# Patient Record
Sex: Male | Born: 1940 | Race: White | Hispanic: No
Health system: Midwestern US, Academic
[De-identification: ages and names within clinical notes are randomized; demographics above are authoritative.]

---

## 2017-03-07 LAB — COMPREHENSIVE METABOLIC PANEL
Lab: 139 — ABNORMAL HIGH (ref 4.1–5.7)
Lab: 30
Lab: 4.5
Lab: 49 — ABNORMAL HIGH (ref 8–40)
Lab: 7.4
Lab: 75

## 2017-03-07 LAB — LIPID PROFILE: Lab: 105 — ABNORMAL HIGH (ref 13.1–16.8)

## 2017-03-07 LAB — THYROID STIMULATING HORMONE-TSH: Lab: 4.7

## 2017-03-07 LAB — PROSTATIC SPECIFIC ANTIGEN-PSA: Lab: 1.3 — ABNORMAL LOW (ref >40)

## 2017-03-07 LAB — CBC: Lab: 7.1

## 2017-03-07 LAB — FREE T4 (FREE THYROXINE) ONLY: Lab: 1.3 — ABNORMAL HIGH (ref 38.2–48.4)

## 2017-03-07 LAB — TOTAL THYROXINE T4: Lab: 9.9 — ABNORMAL HIGH (ref 9–25)

## 2018-01-12 ENCOUNTER — Encounter: Admit: 2018-01-12 | Discharge: 2018-01-12 | Payer: MEDICARE

## 2018-02-03 ENCOUNTER — Encounter: Admit: 2018-02-03 | Discharge: 2018-02-03 | Payer: MEDICARE

## 2018-02-06 ENCOUNTER — Encounter: Admit: 2018-02-06 | Discharge: 2018-02-06 | Payer: MEDICARE

## 2018-02-06 DIAGNOSIS — R0600 Dyspnea, unspecified: Secondary | ICD-10-CM

## 2018-02-06 DIAGNOSIS — E785 Hyperlipidemia, unspecified: Secondary | ICD-10-CM

## 2018-02-06 DIAGNOSIS — I10 Essential (primary) hypertension: Secondary | ICD-10-CM

## 2018-02-06 DIAGNOSIS — E119 Type 2 diabetes mellitus without complications: Secondary | ICD-10-CM

## 2018-02-06 DIAGNOSIS — K219 Gastro-esophageal reflux disease without esophagitis: Secondary | ICD-10-CM

## 2018-02-09 ENCOUNTER — Encounter: Admit: 2018-02-09 | Discharge: 2018-02-09 | Payer: MEDICARE

## 2018-02-09 ENCOUNTER — Ambulatory Visit: Admit: 2018-02-09 | Discharge: 2018-02-09 | Payer: MEDICARE

## 2018-02-09 DIAGNOSIS — I10 Essential (primary) hypertension: Secondary | ICD-10-CM

## 2018-02-09 DIAGNOSIS — R200 Anesthesia of skin: Secondary | ICD-10-CM

## 2018-02-09 DIAGNOSIS — R0600 Dyspnea, unspecified: Secondary | ICD-10-CM

## 2018-02-09 DIAGNOSIS — E119 Type 2 diabetes mellitus without complications: Secondary | ICD-10-CM

## 2018-02-09 DIAGNOSIS — Z951 Presence of aortocoronary bypass graft: Secondary | ICD-10-CM

## 2018-02-09 DIAGNOSIS — E7800 Pure hypercholesterolemia, unspecified: Secondary | ICD-10-CM

## 2018-02-09 DIAGNOSIS — K219 Gastro-esophageal reflux disease without esophagitis: Secondary | ICD-10-CM

## 2018-02-09 DIAGNOSIS — I2510 Atherosclerotic heart disease of native coronary artery without angina pectoris: Secondary | ICD-10-CM

## 2018-02-09 DIAGNOSIS — E785 Hyperlipidemia, unspecified: Secondary | ICD-10-CM

## 2018-02-09 DIAGNOSIS — I4891 Unspecified atrial fibrillation: Secondary | ICD-10-CM

## 2018-02-09 MED ORDER — APIXABAN 5 MG PO TAB
5 mg | ORAL_TABLET | Freq: Two times a day (BID) | ORAL | 11 refills | Status: AC
Start: 2018-02-09 — End: 2018-02-09

## 2018-02-09 MED ORDER — APIXABAN 5 MG PO TAB
5 mg | ORAL_TABLET | Freq: Two times a day (BID) | ORAL | 1 refills | Status: AC
Start: 2018-02-09 — End: 2018-02-09

## 2018-02-09 MED ORDER — METOPROLOL TARTRATE 25 MG PO TAB
25 mg | ORAL_TABLET | Freq: Two times a day (BID) | ORAL | 11 refills | 90.00000 days | Status: AC
Start: 2018-02-09 — End: ?

## 2018-02-09 MED ORDER — APIXABAN 5 MG PO TAB
5 mg | ORAL_TABLET | Freq: Two times a day (BID) | ORAL | 11 refills | Status: AC
Start: 2018-02-09 — End: ?

## 2018-02-09 MED ORDER — APIXABAN 5 MG PO TAB
5 mg | ORAL_TABLET | Freq: Two times a day (BID) | ORAL | 11 refills | Status: CN
Start: 2018-02-09 — End: ?

## 2018-02-10 ENCOUNTER — Encounter: Admit: 2018-02-10 | Discharge: 2018-02-10 | Payer: MEDICARE

## 2018-02-10 DIAGNOSIS — I2510 Atherosclerotic heart disease of native coronary artery without angina pectoris: Secondary | ICD-10-CM

## 2018-02-10 DIAGNOSIS — I4891 Unspecified atrial fibrillation: Secondary | ICD-10-CM

## 2018-02-10 DIAGNOSIS — Z951 Presence of aortocoronary bypass graft: Secondary | ICD-10-CM

## 2018-02-17 ENCOUNTER — Encounter: Admit: 2018-02-17 | Discharge: 2018-02-17 | Payer: Medicare Other

## 2018-03-02 ENCOUNTER — Encounter: Admit: 2018-03-02 | Discharge: 2018-03-02 | Payer: MEDICARE

## 2018-03-02 ENCOUNTER — Ambulatory Visit: Admit: 2018-03-02 | Discharge: 2018-03-02 | Payer: MEDICARE

## 2018-03-02 ENCOUNTER — Ambulatory Visit: Admit: 2018-03-02 | Discharge: 2018-03-03 | Payer: MEDICARE

## 2018-03-02 ENCOUNTER — Encounter: Admit: 2018-03-02 | Discharge: 2018-03-02 | Payer: Medicare Other

## 2018-03-02 DIAGNOSIS — I4891 Unspecified atrial fibrillation: Secondary | ICD-10-CM

## 2018-03-02 DIAGNOSIS — Z951 Presence of aortocoronary bypass graft: Secondary | ICD-10-CM

## 2018-03-14 ENCOUNTER — Encounter: Admit: 2018-03-14 | Discharge: 2018-03-14 | Payer: MEDICARE

## 2018-03-20 ENCOUNTER — Encounter: Admit: 2018-03-20 | Discharge: 2018-03-20 | Payer: MEDICARE

## 2018-03-21 ENCOUNTER — Encounter: Admit: 2018-03-21 | Discharge: 2018-03-21 | Payer: MEDICARE

## 2018-03-21 ENCOUNTER — Ambulatory Visit: Admit: 2018-03-21 | Discharge: 2018-03-21 | Payer: MEDICARE

## 2018-03-21 DIAGNOSIS — R9439 Abnormal result of other cardiovascular function study: ICD-10-CM

## 2018-03-21 DIAGNOSIS — E7800 Pure hypercholesterolemia, unspecified: ICD-10-CM

## 2018-03-21 DIAGNOSIS — E785 Hyperlipidemia, unspecified: ICD-10-CM

## 2018-03-21 DIAGNOSIS — K219 Gastro-esophageal reflux disease without esophagitis: ICD-10-CM

## 2018-03-21 DIAGNOSIS — I2510 Atherosclerotic heart disease of native coronary artery without angina pectoris: Principal | ICD-10-CM

## 2018-03-21 DIAGNOSIS — I10 Essential (primary) hypertension: Principal | ICD-10-CM

## 2018-03-21 DIAGNOSIS — E119 Type 2 diabetes mellitus without complications: ICD-10-CM

## 2018-03-21 DIAGNOSIS — I4821 Permanent atrial fibrillation: ICD-10-CM

## 2018-03-21 DIAGNOSIS — R0600 Dyspnea, unspecified: ICD-10-CM

## 2018-03-21 DIAGNOSIS — R0602 Shortness of breath: ICD-10-CM

## 2018-03-21 DIAGNOSIS — Z951 Presence of aortocoronary bypass graft: ICD-10-CM

## 2018-03-21 DIAGNOSIS — E089 Diabetes mellitus due to underlying condition without complications: ICD-10-CM

## 2018-03-21 DIAGNOSIS — Z7901 Long term (current) use of anticoagulants: ICD-10-CM

## 2018-03-21 DIAGNOSIS — I4510 Unspecified right bundle-branch block: ICD-10-CM

## 2018-03-21 NOTE — Progress Notes
Date of Service: 03/21/2018    Mark Robbins is a 78 y.o. male.       HPI     Mark Robbins is here today for a follow-up office visit.  He was evaluated with a myocardial perfusion imaging study on March 02, 2018, it did show a fixed inferior lateral pattern compatible with previous injury, the liver and request Aulik function was normal at 70%.  Ischemia was not present on this study.  Overall this was not a high risk study.    Patient was evaluated in the past with a myocardial perfusion imaging study in June 2015, at that time this tomographic pattern was not present.    Patient does not have a recent history of symptoms compatible with angina or congestive heart failure.  He has been diagnosed with atrial fibrillation and currently this rate is well controlled and he is anticoagulated with apixaban.  At the time of this dictation a long-term cardiac monitor is pending.  Patient's ventricular rate is 72 bpm, he is currently on metoprolol 25 mg p.o. twice daily, this was decreased at the previous office visit from 50 mg p.o. twice daily.    Patient also reports having blood-tinged urine since started on apixaban.         Vitals:    03/21/18 1342   BP: 120/80   BP Source: Arm, Left Upper   Pulse: 72   SpO2: 97%   Weight: 88.9 kg (196 lb)   Height: 1.778 m (5' 10)   PainSc: Zero     Body mass index is 28.12 kg/m???.     Past Medical History  Patient Active Problem List    Diagnosis Date Noted   ??? 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 RBBB 02/09/2018   ??? SOB (shortness of breath) 02/06/2018   ??? 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     ??? CAD (coronary artery disease) 06/26/2013     2004 at [facility] by Dr. Farris Has: Coronary Artery Bypass times Five (reverse saphenous vein graft anastomosed to the posterior descending artery, Murmurs: no murmur  Carotid Arteries: normal carotid upstroke bilaterally, no bruit  Abdominal Aorta: no abdominal aortic bruit  Lower Extremity Edema: no lower extremity edema  Abdominal Exam: soft, non-tender, no masses, bowel sounds normal  Liver & Spleen: no organomegaly  Neurologic Exam: neurological assessment grossly intact      Cardiovascular Studies  Twelve-lead EKG demonstrates atrial fibrillation, 1 PVC is present, ventricular rate is 63 bpm    Problems Addressed Today  Encounter Diagnoses   Name Primary?   ??? Coronary artery disease involving native coronary artery of native heart without angina pectoris Yes   ??? Hx of CABG    ??? Pure hypercholesterolemia    ??? Essential hypertension    ??? RBBB    ??? Diabetes mellitus due to underlying condition without complication, without long-term current use of insulin (HCC)    ??? SOB (shortness of breath)    ??? Abnormal thallium stress test    ??? Permanent atrial fibrillation (HCC)    ??? On apixaban therapy        Assessment and Plan     In summary: This is a 78 year old white male with a history of CAD, status post CABG in November 2004 (reverse SVG to PDA, reverse SVG to ramus intermedius, OM branch 1,  2 and 3), the most recent myocardial pressure imaging study does show a new finding as compared to the one performed in June 2015, there is an inferior lateral fixed perfusion abnormality, ejection fraction is normal and overall the study does not appear to be high risk.    In addition, patient does have probably permanent atrial fibrillation, his heart rate is well controlled on metoprolol, he is anticoagulated with apixaban.    Early development is represented by hematuria since he was started on apixaban.    Plan:    1.  It is the patient's desire to proceed with rate control and anticoagulation, patient does not want to undergo cardioversion and antiarrhythmic drug therapy.  2.  Continue all present medications 3.  We will follow-up on the results of the Holter monitor when available for interpretation  4.  Due to symptoms of hematuria since apixaban was initiated, I did recommend further urological evaluation (patient also has symptoms/diagnosis of prostate enlargement and he was previously seen in the office by Dr. Larwance Rote).  5.  Follow-up office visit in approximately 3 months.         Current Medications (including today's revisions)  ??? ALPRAZolam (XANAX) 0.25 mg tablet Take 0.25 mg by mouth three times daily as needed for Anxiety.   ??? amLODIPine (NORVASC) 10 mg tablet Take 10 mg by mouth daily.   ??? apixaban (ELIQUIS) 5 mg tablet Take one tablet by mouth twice daily.   ??? clobetasol (TEMOVATE) 0.05 % topical ointment Apply  to affected area twice daily.   ??? finasteride (PROSCAR) 5 mg tablet Take 5 mg by mouth daily.   ??? hydrochlorothiazide (HYDRODIURIL) 25 mg tablet Take 25 mg by mouth daily.   ??? ketoconazole (NIZORAL) 2 % topical cream Apply  topically to affected area daily.   ??? meclizine (ANTIVERT) 12.5 mg tablet Take 12.5 mg by mouth three times daily as needed.   ??? meloxicam (MOBIC) 15 mg tablet Take 15 mg by mouth daily.   ??? metFORMIN (GLUCOPHAGE) 850 mg tablet Take 850 mg by mouth twice daily with meals.   ??? metoprolol tartrate (LOPRESSOR) 25 mg tablet Take one tablet by mouth twice daily.   ??? nitroglycerin (NITROSTAT) 0.4 mg tablet Place 0.4 mg under tongue every 5 minutes as needed for Chest Pain.   ??? omeprazole DR(+) (PRILOSEC) 20 mg capsule Take 20 mg by mouth daily.   ??? potassium chloride SR (K-DUR) 20 mEq tablet Take 30 mEq by mouth daily. Take with a meal and a full glass of water.   ??? pravastatin (PRAVACHOL) 80 mg tablet Take 40 mg by mouth at bedtime daily.   ??? tamsulosin (FLOMAX) 0.4 mg capsule Take 0.4 mg by mouth daily.

## 2018-03-24 ENCOUNTER — Encounter: Admit: 2018-03-24 | Discharge: 2018-03-24 | Payer: MEDICARE

## 2018-03-24 NOTE — Telephone Encounter
-----   Message from Dorris Fetch, MD sent at 03/23/2018  5:03 PM CST -----  Dear Sherilyn Dacosta patient does have an abnormal Holter monitor.  He needs further EP evaluation.  Please schedule him as soon as possible.  He will continue to follow-up with me in [place] but he needs to see one of my EP colleagues in [place]. Jomarie Longs.Enck you  ----- Message -----  From: Dorris Fetch, MD  Sent: 03/23/2018   3:57 PM CST  To: Dorris Fetch, MD

## 2018-03-27 ENCOUNTER — Encounter: Admit: 2018-03-27 | Discharge: 2018-03-27 | Payer: MEDICARE

## 2018-03-27 DIAGNOSIS — I4891 Unspecified atrial fibrillation: Principal | ICD-10-CM

## 2018-04-03 ENCOUNTER — Encounter: Admit: 2018-04-03 | Discharge: 2018-04-03 | Payer: MEDICARE

## 2018-04-03 DIAGNOSIS — I4821 Permanent atrial fibrillation: ICD-10-CM

## 2018-04-03 DIAGNOSIS — I2510 Atherosclerotic heart disease of native coronary artery without angina pectoris: ICD-10-CM

## 2018-04-03 DIAGNOSIS — I10 Essential (primary) hypertension: Principal | ICD-10-CM

## 2018-04-03 DIAGNOSIS — I4510 Unspecified right bundle-branch block: ICD-10-CM

## 2018-04-07 ENCOUNTER — Encounter: Admit: 2018-04-07 | Discharge: 2018-04-07 | Payer: MEDICARE

## 2018-04-07 NOTE — Progress Notes
RECORDS REQUEST     Please send the following records for continuation of care:    Mark Robbins  09-18-1940     Office Notes  EKG's with tracings   Recent Cardiac Testing  Any Cardiac-related records  Recent lab  Cardiac Procedures  H&P/Discharge Summary  Operative Reports- Cardiac     Please fax to:  Desmond Lope [phone]     [facility]  Cardiovascular Medicine

## 2018-04-25 ENCOUNTER — Encounter: Admit: 2018-04-25 | Discharge: 2018-04-25 | Payer: MEDICARE

## 2018-05-18 ENCOUNTER — Encounter: Admit: 2018-05-18 | Discharge: 2018-05-18 | Payer: MEDICARE

## 2018-05-29 ENCOUNTER — Encounter: Admit: 2018-05-29 | Discharge: 2018-05-29 | Payer: MEDICARE

## 2018-05-29 ENCOUNTER — Ambulatory Visit: Admit: 2018-05-29 | Discharge: 2018-05-30 | Payer: MEDICARE

## 2018-05-29 DIAGNOSIS — K219 Gastro-esophageal reflux disease without esophagitis: ICD-10-CM

## 2018-05-29 DIAGNOSIS — R0602 Shortness of breath: Secondary | ICD-10-CM

## 2018-05-29 DIAGNOSIS — I10 Essential (primary) hypertension: Principal | ICD-10-CM

## 2018-05-29 DIAGNOSIS — R0600 Dyspnea, unspecified: ICD-10-CM

## 2018-05-29 DIAGNOSIS — E7800 Pure hypercholesterolemia, unspecified: ICD-10-CM

## 2018-05-29 DIAGNOSIS — E785 Hyperlipidemia, unspecified: ICD-10-CM

## 2018-05-29 DIAGNOSIS — E119 Type 2 diabetes mellitus without complications: ICD-10-CM

## 2018-05-29 NOTE — Patient Instructions
Thank you for visiting our office today.    Please complete the following:    See Dr. Wallene Huh and have an echocardiogram in our [place] office in June 2020. You can call [phone] to schedule this appointment.    See Dr. Avie Arenas in about 6 months.     Thank you,  Cristal Generous., BSN, RN-BC  Ambulatory Clinic RN Care Coordinator    Questions?:  Please use the MyChart on-line portal (or mobile application) to send [place] a message.  Or, contact our nurse line at [phone].      Lab results and test results:  We will release lab results and most test results through MyChart once they are finalized and reviewed by our cardiologist.  If unable to share them through MyChart, our nursing staff will call you.    Medication Refills:  Contact your pharmacy or send [place] a message through MyChart.

## 2018-05-30 DIAGNOSIS — I4510 Unspecified right bundle-branch block: Principal | ICD-10-CM

## 2018-05-30 DIAGNOSIS — I2510 Atherosclerotic heart disease of native coronary artery without angina pectoris: Secondary | ICD-10-CM

## 2018-05-30 DIAGNOSIS — I10 Essential (primary) hypertension: ICD-10-CM

## 2018-06-07 ENCOUNTER — Encounter: Admit: 2018-06-07 | Discharge: 2018-06-07 | Payer: MEDICARE

## 2018-06-07 NOTE — Telephone Encounter
06/07/2018 10:39 AM   LMOM to offer 09/21/18 follow with Dr Avie Arenas at [place] [facility].  Colvin Caroli, LPN

## 2018-09-15 ENCOUNTER — Encounter: Admit: 2018-09-15 | Discharge: 2018-09-15

## 2018-09-15 NOTE — Progress Notes
Noah Charon Wengert, September 15, 1940 has an appointment with Dr. Arrie Eastern on 09/19/2018.    Please send recent lab results for continuity of care.    Thank you,   Camrie Stock    Phone: [phone]  Fax: [phone]

## 2018-09-19 ENCOUNTER — Encounter: Admit: 2018-09-19 | Discharge: 2018-09-19

## 2018-09-19 ENCOUNTER — Ambulatory Visit: Admit: 2018-09-19 | Discharge: 2018-09-19 | Payer: MEDICARE

## 2018-09-19 ENCOUNTER — Ambulatory Visit: Admit: 2018-09-19 | Discharge: 2018-09-19

## 2018-09-19 DIAGNOSIS — R001 Bradycardia, unspecified: Secondary | ICD-10-CM

## 2018-09-19 DIAGNOSIS — I4819 Other persistent atrial fibrillation: Secondary | ICD-10-CM

## 2018-09-19 DIAGNOSIS — I2510 Atherosclerotic heart disease of native coronary artery without angina pectoris: Secondary | ICD-10-CM

## 2018-09-19 DIAGNOSIS — K219 Gastro-esophageal reflux disease without esophagitis: Secondary | ICD-10-CM

## 2018-09-19 DIAGNOSIS — I4510 Unspecified right bundle-branch block: Secondary | ICD-10-CM

## 2018-09-19 DIAGNOSIS — E119 Type 2 diabetes mellitus without complications: Secondary | ICD-10-CM

## 2018-09-19 DIAGNOSIS — R0602 Shortness of breath: Secondary | ICD-10-CM

## 2018-09-19 DIAGNOSIS — E785 Hyperlipidemia, unspecified: Secondary | ICD-10-CM

## 2018-09-19 DIAGNOSIS — I10 Essential (primary) hypertension: Secondary | ICD-10-CM

## 2018-09-19 DIAGNOSIS — R0600 Dyspnea, unspecified: Secondary | ICD-10-CM

## 2018-09-19 MED ORDER — PERFLUTREN LIPID MICROSPHERES 1.1 MG/ML IV SUSP
1-20 mL | Freq: Once | INTRAVENOUS | 0 refills | Status: CP | PRN
Start: 2018-09-19 — End: ?
  Administered 2018-09-19: 19:00:00 2 mL via INTRAVENOUS

## 2018-09-19 NOTE — Patient Instructions
Some dates available for pacemaker placement are 8/25, 9/17 and 9/24.    Call Lucy at [phone]    In order to provide you the best care possible we ask that you follow up as below:    For NON-URGENT questions please contact us through your MyChart account.   For all medication refills please contact your pharmacy or send a request through [facility].   For all questions that may need to be addressed urgently please call the nursing triage line at [phone] Monday - Friday 8-5 only. Please leave a detailed message with your name, date of birth, and reason for your call.    To schedule an appointment call [phone].     Please allow 10-15 business days for the results of any testing to be reviewed. Please call our office if you have not heard from a nurse within this time frame.

## 2018-09-19 NOTE — Progress Notes
Date of Service: 09/19/2018    Mark Robbins is a 78 y.o. male.       HPI     I had the pleasure of seeing Mr. Mark Robbins in response to request from my colleague, Dr. Avie Arenas, regarding multiple pauses on the event monitor and atrial fibrillation.  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     As you recall, he is a 78 year old delightful gentleman with a known history of hypertension, diabetes, neuropathy, Agent Orange exposure, who underwent coronary artery bypass surgery in 2004 by Dr. Farris Has at [facility].  He also has history of significant arthritis, requiring left knee total replacement and right total hip replacement and possibly needing left hip replacement in the future.  He also has benign prostatic hypertrophy with frequent hematuria, especially worse in the setting of blood thinner.     He established care with Dr. Avie Arenas approximately a year ago and was diagnosed with permanent atrial fibrillation.  She also ordered a stress test and event monitor at that time.  Stress test was negative for any reversible ischemia, only with the old permanent injury.  Event monitor was performed in February 2020 after cutting the metoprolol dose to 25 mg twice a day.  It showed multiple 3- to 4-second pauses, and therefore, she referred him for further electrophysiology evaluation.     Mark Robbins symptom is primarily shortness of breath especially that occurred around last Thanksgiving.  He went to PCP and got a chest x-ray and confirmed lung edema, therefore, was referred to Dr. Avie Arenas.  He was started on hydrochlorothiazide in addition to amlodipine and metoprolol.  Eliquis 5 mg twice a day was initiated for anticoagulation.  He was asked to see a urologist for his hematuria.     Mark Robbins denies having any syncope or presyncope in the past.  He does complain of having a significant amount of fatigue and lack of energy for the last 1 year. I reviewed his 12-lead EKG today, which shows atrial fibrillation with controlled ventricular response at 54 beats per minute, QRS 158, QTc 452 msec.  Intermittent bradycardia is noted.     Event monitor was reviewed, which clearly shows multiple daytime episodes of severe bradycardia from high-grade AV nodal disease with heart rate going  29 beats per minute.  There are at least 2 or 3 pauses that last as long as 3-4 seconds, again all during the daytime, and he does not usually sleep during the daytime.     His stress test, as mentioned above, showed EF 70% with an injury pattern, but no reversible ischemia in the inferior wall.  An echocardiogram was not performed.     1. Persistent atrial fibrillation with possible symptoms of fatigue and lack of energy for the last 1 year.  2. 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  3. Coronary artery disease, status post bypass surgery in 2004.'  4. Hypertension.  5. Diabetes.  6. Peripheral neuropathy.  7. Arthritis with multiple joint replacements.  8. BPH with hematuria.  9. CHADS-VASc score of 5.     We had a very detailed discussion about next course of action for Mark Robbins.  I believe his bradycardia is significant and could cause issues down the lane.  Even though he is not having any dizzy spells, he has significant fatigue and lack of  energy.  His heart rate can get up occasionally in the 130 beats per minute range, and unfortunately, we cannot change his metoprolol dose at this point because of the bradycardia.  He would benefit from a single-chamber permanent pacemaker, and he understands the benefits as well as risks, including, but not limited to, less than 1% chance of infection, bleeding, cardiac perforation, pneumothorax, etc.  He would like to discuss with family and get back to [place] about scheduling the procedure. I also believe that he could be a good candidate for left atrial appendage occlusion device, like Watchman, in the setting of hematuria on anticoagulation.  I would recommend an echocardiogram also.     Thank you for letting us participate in the care of your patient.  Please feel free to contact us with any questions or concerns.    ([id number])    I reviewed the echocardiogram that was performed today, and it looks like he has normal LV function, mild mitral regurgitation and the left atrial size is only about 4.8 cm.  This would suggest that he could benefit from possible rhythm control approaches in the future.  His AFib was only diagnosed in January of 2020, and so he has been in it for about 8 months.  I would consider this as more of a persistent atrial fibrillation rather than permanent atrial fibrillation.  He may also benefit from an atrial lead if we were to put in a pacemaker and cardiovert him at that time.    ([id number])             Vitals:    09/19/18 1354   BP: 129/80   BP Source: Arm, Left Upper   Pulse: 54   Weight: 89.3 kg (196 lb 14.4 oz)   Height: 1.778 m (5' 10)   PainSc: Zero     Body mass index is 28.25 kg/m???.     Past Medical History  Patient Active Problem List    Diagnosis Date Noted   ??? Other microscopic hematuria 05/29/2018   ??? Abnormal thallium stress test 03/21/2018   ??? Permanent atrial fibrillation (HCC) 03/21/2018     03/02/2018 - [facility]:  Atrial fibrillation occurred 100% of the time.  One episode of nonsustained VT occurred it lasted 1.4 seconds with an average heart rate of 159 bpm.  Several pauses occurred, the longest lasted 3.4 seconds on 03/12/2018 at 12:36 PM, other pauses were also present and they all lasted between 3-3.4 seconds, they did occur during wake hours.  There are also episodes of faster atrial fibrillation with a heart rate up to 137 bpm, however slow atrial fibrillation also occurred.  Atrial flutter did not occur. ??? On apixaban therapy 03/21/2018   ??? 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 RBBB 02/09/2018   ??? SOB (shortness of breath) 02/06/2018   ??? 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     ??? CAD (coronary artery disease) 06/26/2013     2004 at [place] by Dr. Farris Has: Coronary Artery Bypass times Five (reverse saphenous vein graft anastomosed to the posterior descending artery, reverse saphenous vein graft anastomosed in sequence to the intermediate ramus, first obtuse marginal, second obtuse marginal, and third obtuse marginal arteries).    12/14/2002 Heart Cath:   1. Patent left main artery.  2. Patent left anterior descending artery.  3. First diagonal is  80% proximal stenosis.  4. Left circumflex proximal 60% stenosis and totally occluded distally beyond third obtuse marginal.  a. 80-90% proximal stenosis second obtuse marginal.  b. 60% stenosis in third obtuse marginal.  5. Right coronary artery is totally occluded and fills from the septal collateral branches from the left side.  6. Patent posterior descending artery.  7. Diffusely occluded posterolateral vessel.  8. Normal left ventricular systolic function with 3+ mitral regurgitation    03/02/2018 - MPI:  This study is abnormal with a mostly fixed but viable defect in the inferior wall.  The very prominent liver activity may lower the sensitivity of the study due to shine through artifact in the inferior wall.  No other definite perfusion defects are present global left rectal function is within normal limits.  There is mild abnormal septal motion present.         ??? Hypertension 06/26/2013   ??? GERD (gastroesophageal reflux disease) 06/26/2013   ??? Hyperlipemia 06/26/2013   ??? Diabetes mellitus (HCC) 06/26/2013         Review of Systems   Constitution: Negative.   HENT: Negative.    Eyes: Negative.    Cardiovascular: Negative. Respiratory: Negative.    Endocrine: Negative.    Hematologic/Lymphatic: Negative.    Skin: Negative.    Musculoskeletal: Positive for joint pain.   Gastrointestinal: Negative.    Genitourinary: Negative.    Neurological: Negative.    Psychiatric/Behavioral: Negative.    Allergic/Immunologic: Negative.        Physical Exam  Patient is a moderately well-built gentleman who is comfortable at rest,  not in any distress.  Sclerae anicteric.  The oral mucosa is moist and pink.  Neck is  supple without any lymphadenopathy.  Lungs are clear to auscultation bilaterally.  Breath  sounds are normal.  Cardiac exam reveals normal S1, S2 with regular rate and  rhythm.  No murmurs, rubs or gallops noted.  Abdomen:  Soft, nontender, nondistended.  Bowel sounds are present.  Extremities:  No cyanosis, clubbing or edema.  Peripheral  pulses are symmetric.  Skin without any rash. . No gross motor or neuro deficits.      Cardiovascular Studies      Problems Addressed Today  No diagnosis found.    Assessment and Plan     As above in HPI section.           Current Medications (including today's revisions)  ??? amLODIPine (NORVASC) 10 mg tablet Take 10 mg by mouth daily.   ??? apixaban (ELIQUIS) 5 mg tablet Take one tablet by mouth twice daily.   ??? clobetasol (TEMOVATE) 0.05 % topical ointment Apply  to affected area twice daily.   ??? finasteride (PROSCAR) 5 mg tablet Take 5 mg by mouth daily.   ??? hydrochlorothiazide (HYDRODIURIL) 25 mg tablet Take 25 mg by mouth daily.   ??? Hydrocortisone-Pramoxine (ANALPRAM-HC) 2.5-1 % crea USE SMALL AMOUNT INTO RECTUM TWO TIMES A DAY AS NEEDED FOR RECTAL SYMPTOMS   ??? meclizine (ANTIVERT) 12.5 mg tablet Take 12.5 mg by mouth three times daily as needed.   ??? meloxicam (MOBIC) 15 mg tablet Take 15 mg by mouth daily.   ??? metFORMIN (GLUCOPHAGE) 850 mg tablet Take 850 mg by mouth twice daily with meals.   ??? metoprolol tartrate (LOPRESSOR) 25 mg tablet Take one tablet by mouth twice daily. ??? nitroglycerin (NITROSTAT) 0.4 mg tablet Place 0.4 mg under tongue every 5 minutes as needed for Chest Pain.   ??? omeprazole DR(+) (  PRILOSEC) 20 mg capsule Take 20 mg by mouth daily.   ??? potassium chloride SR (K-DUR) 20 mEq tablet Take 30 mEq by mouth daily. Take with a meal and a full glass of water.   ??? pravastatin (PRAVACHOL) 80 mg tablet Take 40 mg by mouth at bedtime daily.   ??? tamsulosin (FLOMAX) 0.4 mg capsule Take 0.4 mg by mouth daily.

## 2018-09-20 ENCOUNTER — Encounter: Admit: 2018-09-20 | Discharge: 2018-09-20

## 2018-09-20 DIAGNOSIS — E119 Type 2 diabetes mellitus without complications: Secondary | ICD-10-CM

## 2018-09-20 DIAGNOSIS — R0600 Dyspnea, unspecified: Secondary | ICD-10-CM

## 2018-09-20 DIAGNOSIS — E785 Hyperlipidemia, unspecified: Secondary | ICD-10-CM

## 2018-09-20 DIAGNOSIS — I10 Essential (primary) hypertension: Secondary | ICD-10-CM

## 2018-09-20 DIAGNOSIS — K219 Gastro-esophageal reflux disease without esophagitis: Secondary | ICD-10-CM

## 2018-09-25 ENCOUNTER — Encounter: Admit: 2018-09-25 | Discharge: 2018-09-25

## 2018-09-25 NOTE — Telephone Encounter
I called the patient to discuss.   Patient will call with the date he would like to proceed with.  He states that he is not ready to pick yet.

## 2018-09-25 NOTE — Telephone Encounter
-----   Message from Louis Meckel, LPN sent at 7/82/9562  1:28 PM CDT -----  Regarding: RAD- procedure ?  VM from patient on triage line.  He needs to have pacemaker implant.  Does RAD do them at [facility] as he is only 26 miles from there and 60 miles from [place].  Call him at [phone].

## 2018-09-28 ENCOUNTER — Encounter: Admit: 2018-09-28 | Discharge: 2018-09-28

## 2018-09-28 DIAGNOSIS — Z1159 Encounter for screening for other viral diseases: Secondary | ICD-10-CM

## 2018-09-28 DIAGNOSIS — I480 Paroxysmal atrial fibrillation: Secondary | ICD-10-CM

## 2018-09-28 DIAGNOSIS — I4589 Other specified conduction disorders: Secondary | ICD-10-CM

## 2018-09-28 MED ORDER — SODIUM CHLORIDE 0.9 % IV SOLP
INTRAVENOUS | 0 refills | Status: CN
Start: 2018-09-28 — End: ?

## 2018-09-28 MED ORDER — CEFAZOLIN INJ 1GM IVP
1 g | INTRAVENOUS | 0 refills | Status: CN
Start: 2018-09-28 — End: ?

## 2018-09-28 MED ORDER — CEFAZOLIN INJ 1GM IVP
2 g | Freq: Once | INTRAVENOUS | 0 refills | Status: CN
Start: 2018-09-28 — End: ?

## 2018-09-28 MED ORDER — LIDOCAINE (PF) 10 MG/ML (1 %) IJ SOLN
.1-2 mL | INTRAMUSCULAR | 0 refills | Status: CN | PRN
Start: 2018-09-28 — End: ?

## 2018-09-28 NOTE — Telephone Encounter
-----   Message from Louis Meckel, LPN sent at 5/45/6256 11:34 AM CDT -----  Regarding: RAD- RC with date  VM from patient on triage line.  He would like to schedule pacemaker implant for 10-26-18.  Call him at [phone].

## 2018-09-28 NOTE — Telephone Encounter
Returned call to Mark Robbins regarding scheduling a Dual Chamber [employer] Pacemaker  He initially requested 9/24 however, since he was given that date as a possible date, Dr Dendi's schedule filled.  Talked with Mark Dakin about scheduling a date sooner however, he was pretty adamant that he did not want to have the procedure completed before the end of september.  He decided to have his procedure on 11/10/19 with Dr Arrie Eastern  discussed he would need to have an update history and physical which he would prefer to have completed at the [place] we will be in touch with him once pre procedure instructions and dates confirmed.

## 2018-09-29 ENCOUNTER — Encounter: Admit: 2018-09-29 | Discharge: 2018-09-29

## 2018-09-29 DIAGNOSIS — I480 Paroxysmal atrial fibrillation: Secondary | ICD-10-CM

## 2018-09-29 DIAGNOSIS — I4589 Other specified conduction disorders: Secondary | ICD-10-CM

## 2018-10-05 ENCOUNTER — Encounter: Admit: 2018-10-05 | Discharge: 2018-10-05

## 2018-10-26 ENCOUNTER — Encounter: Admit: 2018-10-26 | Discharge: 2018-10-26 | Payer: MEDICARE

## 2018-11-01 ENCOUNTER — Encounter: Admit: 2018-11-01 | Discharge: 2018-11-01 | Payer: MEDICARE

## 2018-11-01 DIAGNOSIS — I10 Essential (primary) hypertension: Secondary | ICD-10-CM

## 2018-11-01 DIAGNOSIS — I495 Sick sinus syndrome: Secondary | ICD-10-CM

## 2018-11-01 DIAGNOSIS — I4819 Other persistent atrial fibrillation: Secondary | ICD-10-CM

## 2018-11-01 DIAGNOSIS — I4510 Unspecified right bundle-branch block: Secondary | ICD-10-CM

## 2018-11-01 DIAGNOSIS — E119 Type 2 diabetes mellitus without complications: Secondary | ICD-10-CM

## 2018-11-01 DIAGNOSIS — R9439 Abnormal result of other cardiovascular function study: Secondary | ICD-10-CM

## 2018-11-01 DIAGNOSIS — I2510 Atherosclerotic heart disease of native coronary artery without angina pectoris: Secondary | ICD-10-CM

## 2018-11-01 DIAGNOSIS — Z951 Presence of aortocoronary bypass graft: Secondary | ICD-10-CM

## 2018-11-01 DIAGNOSIS — K219 Gastro-esophageal reflux disease without esophagitis: Secondary | ICD-10-CM

## 2018-11-01 DIAGNOSIS — E7800 Pure hypercholesterolemia, unspecified: Secondary | ICD-10-CM

## 2018-11-01 DIAGNOSIS — R0600 Dyspnea, unspecified: Secondary | ICD-10-CM

## 2018-11-01 DIAGNOSIS — E089 Diabetes mellitus due to underlying condition without complications: Secondary | ICD-10-CM

## 2018-11-01 DIAGNOSIS — E785 Hyperlipidemia, unspecified: Secondary | ICD-10-CM

## 2018-11-01 DIAGNOSIS — R9431 Abnormal electrocardiogram [ECG] [EKG]: Secondary | ICD-10-CM

## 2018-11-02 ENCOUNTER — Encounter: Admit: 2018-11-02 | Discharge: 2018-11-02 | Payer: MEDICARE

## 2018-11-02 DIAGNOSIS — I4510 Unspecified right bundle-branch block: Secondary | ICD-10-CM

## 2018-11-02 NOTE — Telephone Encounter
-----   Message from Louis Meckel, LPN sent at 11/01/7508 10:13 AM CDT -----  Regarding: RAD- ? hold Eliquis  VM from patient on triage line.  He is scheduled for PPM on 11-10-18.  Does he need to hold the Eliquis prior or continue to take it through the procedure?  Call him back at [phone].

## 2018-11-02 NOTE — Telephone Encounter
Left VM to CB.  Need to review pre-procedure instructions and patient needs to have lab work done as well as [place].

## 2018-11-02 NOTE — Patient Instructions
ELECTROPHYSIOLOGY PRE-ADMISSION INSTRUCTIONS    Patient Name: Mark Robbins  MRN#: [id number]  Date of Birth: [birth year] (78 y.o.)  Today's Date: 11/02/2018    PROCEDURE:  You are scheduled for Implantation of a Dual Chamber Pacemaker with Dr. Adella Hare Dendi.      ARRIVAL TIME:  Please report to the [facility] admitting office on the ground floor of the [facility] on: 10/9  The EP Lab will call to notify you of your arrival time.  They will call on the business day prior to your procedure.  (If you have any questions regarding your arrival time for the Electrophysiology Lab, please call the EP Lab at [phone].)    PRE-PROCEDURE APPOINTMENTS:  Anytime Pre-Admission lab work: BMP, CBC and Magnesium at the lab of your choice.         SPECIAL MEDICATION INSTRUCTIONS  Nothing to eat or drink after midnight before your procedure.  Take your prescription medications with a sip of water as instructed.  No caffeine for 24 hours prior to your procedure.     Any new prescriptions will be sent to your pharmacy listed on file with [place].   HOLD ALL over the counter vitamins or supplements on the morning of your procedure.  Diuretics: HCTZ (hydrochlorothiazide) -- hold the morning of your procedure.     Additional Instructions  If you wear CPAP, please bring your mask and machine with you to the hospital.    Take a bath or shower with anti-bacterial soap the evening before, or the morning of the procedure. We will give this to you at your office visit.     Bring photo ID and your health insurance card(s).    Arrange for a driver to take you home from the hospital.    Bring an accurate list of your current medications with you to the hospital (all meds and supplements taken daily).    Wear comfortable clothes and don't bring valuables, other than photo identification card, with you to the hospital.    Please pack a bag for an overnight stay. Please review your pre-procedure instructions and call the office at [phone] with any questions. You may ask to speak with any of Dr. Adella Hare Dendi's nurses. There are several of [place] in the office that can assist you. For questions regarding post procedure care or restrictions please refer to the Your Care Instructions included in the  Implantation of a Radio broadcast assistant.     ALLERGIES  Allergies   Allergen Reactions   ? Levothyroxine SEE COMMENTS     Caused feeling of weakness   ? Lisinopril COUGH       CURRENT MEDICATIONS   Cervando, Durnin   Home Medication Instructions HAR:    Printed on:11/02/18 1400   Medication Information                      amLODIPine (NORVASC) 10 mg tablet  Take 10 mg by mouth daily.             apixaban (ELIQUIS) 5 mg tablet  Take one tablet by mouth twice daily.             clobetasol (TEMOVATE) 0.05 % topical ointment  Apply  to affected area twice daily.             finasteride (PROSCAR) 5 mg tablet  Take 5 mg by mouth daily.  hydrochlorothiazide (HYDRODIURIL) 25 mg tablet  Take 25 mg by mouth daily.             Hydrocortisone-Pramoxine (ANALPRAM-HC) 2.5-1 % crea  USE SMALL AMOUNT INTO RECTUM TWO TIMES A DAY AS NEEDED FOR RECTAL SYMPTOMS             meclizine (ANTIVERT) 12.5 mg tablet  Take 12.5 mg by mouth three times daily as needed.             meloxicam (MOBIC) 15 mg tablet  Take 15 mg by mouth daily.             metFORMIN (GLUCOPHAGE) 850 mg tablet  Take 850 mg by mouth twice daily with meals.             metoprolol tartrate (LOPRESSOR) 25 mg tablet  Take one tablet by mouth twice daily.             nitroglycerin (NITROSTAT) 0.4 mg tablet  Place 0.4 mg under tongue every 5 minutes as needed for Chest Pain.             omeprazole DR(+) (PRILOSEC) 20 mg capsule  Take 20 mg by mouth daily.             potassium chloride SR (K-DUR) 20 mEq tablet  Take 30 mEq by mouth daily. Take with a meal and a full glass of water. pravastatin (PRAVACHOL) 80 mg tablet  Take 40 mg by mouth at bedtime daily.             tamsulosin (FLOMAX) 0.4 mg capsule  Take 0.4 mg by mouth daily.               _________________________________________  Form completed by: Maryann Conners, RN  Date completed: 11/02/18  Method: Via telephone and mailed to the patient.

## 2018-11-03 ENCOUNTER — Encounter: Admit: 2018-11-03 | Discharge: 2018-11-03 | Payer: MEDICARE

## 2018-11-03 NOTE — Telephone Encounter
Reviewed pre procedure instructions with Mark Robbins and he verbalized understanding    fax number for lab:  [phone] at [place] testing has to be ordered by pcp -     talked with Mark Robbins,  he will contact Dr Galvin Proffer office to get order for covid testing  Mark Robbins will have his labs drawn at [facility]. orders faxed    Mark Robbins will c/b if any concerns or plan changes from above.

## 2018-11-07 ENCOUNTER — Encounter: Admit: 2018-11-07 | Discharge: 2018-11-07 | Payer: MEDICARE

## 2018-11-07 LAB — BASIC METABOLIC PANEL
Lab: 1.2
Lab: 105
Lab: 131 — ABNORMAL HIGH (ref 70–105)
Lab: 141
Lab: 21
Lab: 27 — ABNORMAL HIGH (ref 8.4–25.7)
Lab: 4.2
Lab: 60
Lab: 9.9

## 2018-11-07 LAB — CBC
Lab: 14
Lab: 15
Lab: 154
Lab: 28
Lab: 32
Lab: 48
Lab: 7.7
Lab: 88

## 2018-11-07 LAB — COVID-19 (SARS-COV-2) PCR

## 2018-11-07 LAB — MAGNESIUM: Lab: 1.8

## 2018-11-08 MED ORDER — ACETAMINOPHEN 325 MG PO TAB
650 mg | ORAL | 0 refills | Status: CN | PRN
Start: 2018-11-08 — End: ?

## 2018-11-08 MED ORDER — ALUMINUM-MAGNESIUM HYDROXIDE 200-200 MG/5 ML PO SUSP
30 mL | ORAL | 0 refills | Status: CN | PRN
Start: 2018-11-08 — End: ?

## 2018-11-10 ENCOUNTER — Encounter: Admit: 2018-11-10 | Discharge: 2018-11-10 | Payer: MEDICARE

## 2018-11-10 DIAGNOSIS — I10 Essential (primary) hypertension: Secondary | ICD-10-CM

## 2018-11-10 DIAGNOSIS — E785 Hyperlipidemia, unspecified: Secondary | ICD-10-CM

## 2018-11-10 DIAGNOSIS — K219 Gastro-esophageal reflux disease without esophagitis: Secondary | ICD-10-CM

## 2018-11-10 DIAGNOSIS — E119 Type 2 diabetes mellitus without complications: Secondary | ICD-10-CM

## 2018-11-10 DIAGNOSIS — R0600 Dyspnea, unspecified: Secondary | ICD-10-CM

## 2018-11-10 LAB — BASIC METABOLIC PANEL
Lab: 1.1 mg/dL (ref 0.4–1.24)
Lab: 10 mg/dL (ref 8.5–10.6)
Lab: 106 MMOL/L (ref 98–110)
Lab: 142 MMOL/L (ref 137–147)
Lab: 154 mg/dL — ABNORMAL HIGH (ref 70–100)
Lab: 23 MMOL/L (ref 21–30)
Lab: 27 mg/dL — ABNORMAL HIGH (ref 7–25)
Lab: 3.9 MMOL/L (ref 3.5–5.1)
Lab: >60 mL/min (ref >60)
Lab: 13 — ABNORMAL HIGH (ref 3–12)

## 2018-11-10 LAB — POC GLUCOSE: Lab: 147 mg/dL — ABNORMAL HIGH (ref 70–100)

## 2018-11-10 LAB — POC PT/INR: Lab: 1.1 (ref 0.8–1.2)

## 2018-11-10 MED ORDER — ALUMINUM-MAGNESIUM HYDROXIDE 200-200 MG/5 ML PO SUSP
30 mL | ORAL | 0 refills | Status: DC | PRN
Start: 2018-11-10 — End: 2018-11-11

## 2018-11-10 MED ORDER — LIDOCAINE (PF) 10 MG/ML (1 %) IJ SOLN
.1-2 mL | INTRAMUSCULAR | 0 refills | Status: DC | PRN
Start: 2018-11-10 — End: 2018-11-11

## 2018-11-10 MED ORDER — CEFAZOLIN INJ 1GM IVP
2 g | Freq: Once | INTRAVENOUS | 0 refills | Status: CP
Start: 2018-11-10 — End: ?

## 2018-11-10 MED ORDER — HYDROCHLOROTHIAZIDE 25 MG PO TAB
25 mg | Freq: Every day | ORAL | 0 refills | Status: DC
Start: 2018-11-10 — End: 2018-11-11
  Administered 2018-11-11: 14:00:00 25 mg via ORAL

## 2018-11-10 MED ORDER — APIXABAN 5 MG PO TAB
5 mg | Freq: Every day | ORAL | 0 refills | Status: CP
Start: 2018-11-10 — End: ?
  Administered 2018-11-10: 16:00:00 5 mg via ORAL

## 2018-11-10 MED ORDER — SODIUM CHLORIDE 0.9 % IV SOLP
INTRAVENOUS | 0 refills | Status: DC
Start: 2018-11-10 — End: 2018-11-11
  Administered 2018-11-10: 15:00:00 1000.000 mL via INTRAVENOUS

## 2018-11-10 MED ORDER — ACETAMINOPHEN 325 MG PO TAB
650 mg | ORAL | 0 refills | Status: DC | PRN
Start: 2018-11-10 — End: 2018-11-11

## 2018-11-10 MED ORDER — METOPROLOL TARTRATE 25 MG PO TAB
25 mg | Freq: Two times a day (BID) | ORAL | 0 refills | Status: DC
Start: 2018-11-10 — End: 2018-11-11
  Administered 2018-11-11 (×2): 25 mg via ORAL

## 2018-11-10 MED ORDER — APIXABAN 5 MG PO TAB
5 mg | Freq: Two times a day (BID) | ORAL | 0 refills | Status: DC
Start: 2018-11-10 — End: 2018-11-11
  Administered 2018-11-11 (×2): 5 mg via ORAL

## 2018-11-10 MED ORDER — AMLODIPINE 10 MG PO TAB
10 mg | Freq: Every day | ORAL | 0 refills | Status: DC
Start: 2018-11-10 — End: 2018-11-11
  Administered 2018-11-11: 14:00:00 10 mg via ORAL

## 2018-11-10 MED ORDER — ONDANSETRON HCL (PF) 4 MG/2 ML IJ SOLN
4 mg | INTRAVENOUS | 0 refills | Status: DC | PRN
Start: 2018-11-10 — End: 2018-11-11

## 2018-11-10 MED ORDER — MELATONIN 5 MG PO TAB
5 mg | Freq: Every evening | ORAL | 0 refills | Status: DC
Start: 2018-11-10 — End: 2018-11-11
  Administered 2018-11-11: 04:00:00 5 mg via ORAL

## 2018-11-10 MED ORDER — HYDROCODONE-ACETAMINOPHEN 5-325 MG PO TAB
1 | ORAL | 0 refills | Status: DC | PRN
Start: 2018-11-10 — End: 2018-11-11

## 2018-11-10 MED ORDER — CEFAZOLIN INJ 1GM IVP
1 g | INTRAVENOUS | 0 refills | Status: CP
Start: 2018-11-10 — End: ?
  Administered 2018-11-11: 01:00:00 1 g via INTRAVENOUS

## 2018-11-11 ENCOUNTER — Encounter: Admit: 2018-11-11 | Discharge: 2018-11-11 | Payer: MEDICARE

## 2018-11-11 LAB — CBC
Lab: 15 % — ABNORMAL HIGH (ref 11–15)
Lab: 29 pg (ref 26–34)
Lab: 34 g/dL (ref 32.0–36.0)
Lab: 43 % (ref 40–50)
Lab: 5 M/UL (ref 4.4–5.5)
Lab: 7.7 10*3/uL (ref 4.5–11.0)
Lab: 8.6 FL (ref >60)
Lab: 85 FL — ABNORMAL HIGH (ref 80–100)

## 2018-11-11 LAB — BASIC METABOLIC PANEL: Lab: 140 MMOL/L (ref 137–147)

## 2018-11-13 ENCOUNTER — Encounter: Admit: 2018-11-13 | Discharge: 2018-11-13 | Payer: MEDICARE

## 2018-11-13 ENCOUNTER — Ambulatory Visit: Admit: 2018-11-13 | Discharge: 2018-11-13 | Payer: MEDICARE

## 2018-11-13 DIAGNOSIS — K219 Gastro-esophageal reflux disease without esophagitis: Secondary | ICD-10-CM

## 2018-11-13 DIAGNOSIS — I10 Essential (primary) hypertension: Secondary | ICD-10-CM

## 2018-11-13 DIAGNOSIS — E119 Type 2 diabetes mellitus without complications: Secondary | ICD-10-CM

## 2018-11-13 DIAGNOSIS — R0600 Dyspnea, unspecified: Secondary | ICD-10-CM

## 2018-11-13 DIAGNOSIS — E785 Hyperlipidemia, unspecified: Secondary | ICD-10-CM

## 2018-11-16 ENCOUNTER — Encounter: Admit: 2018-11-16 | Discharge: 2018-11-16 | Payer: MEDICARE

## 2018-11-16 DIAGNOSIS — K219 Gastro-esophageal reflux disease without esophagitis: Secondary | ICD-10-CM

## 2018-11-16 DIAGNOSIS — E785 Hyperlipidemia, unspecified: Secondary | ICD-10-CM

## 2018-11-16 DIAGNOSIS — E119 Type 2 diabetes mellitus without complications: Secondary | ICD-10-CM

## 2018-11-16 DIAGNOSIS — I10 Essential (primary) hypertension: Secondary | ICD-10-CM

## 2018-11-16 DIAGNOSIS — R0600 Dyspnea, unspecified: Secondary | ICD-10-CM

## 2018-11-16 NOTE — Progress Notes
Removed Silverlon/Aquacel dressing. Left prepectoral incision is clean, dry, well approximated, and healing without evidence of drainage or discharge. Steri-Strips dry and intact. Pt reports no adverse symptoms. Incision care, including signs and symptoms of infection, reviewed(as below). Pt verbalized understanding and will remain in phone contact.    Patient reports left should discomfort, which has improved since PM implant.  There is no redness, swelling, warmth or other s/s infection or dvt.  Pt will monitor and let us know if pain worsens.      You may shower once dressing removed at follow up appointment; however avoid direct contact with the incision (allow the water to hit the back of your shoulder rather than directly on the incision).    Do not submerge incision in tub, pool, hot tub, or lake for 4 weeks.    Unless your incision is bleeding or draining, keep it open to air.    Avoid applying deodorants, powders, creams, lotions, etc. to your incision for 4 weeks.    Usually there are no stitches to be removed. Steri-strips will begin to fall off in 10-14 days. If they remain after 2 weeks, gently remove them when they are damp after a shower.    Your incision should gradually look better each day. Please notify our office immediately if you notice any of the following:   -an increase in swelling or redness   -any drainage   -increasing pain at the incision site  -fever over 100 degrees

## 2018-11-17 ENCOUNTER — Encounter: Admit: 2018-11-17 | Discharge: 2018-11-17 | Payer: MEDICARE

## 2018-11-17 NOTE — Telephone Encounter
-----   Message from Ilean Skill, APRN-NP sent at 11/10/2018  4:31 PM CDT -----  Regarding: APPT  Needs OV in 1 month with RAD to talk about AAD followed by [facility].[name] or visa versa. Whatever he wants.

## 2018-11-17 NOTE — Telephone Encounter
Called and scheduled the patient for follow up.

## 2018-11-23 ENCOUNTER — Encounter: Admit: 2018-11-23 | Discharge: 2018-11-23 | Payer: MEDICARE

## 2018-11-28 ENCOUNTER — Encounter: Admit: 2018-11-28 | Discharge: 2018-11-28 | Payer: MEDICARE

## 2018-11-28 ENCOUNTER — Ambulatory Visit: Admit: 2018-11-28 | Discharge: 2018-11-28 | Payer: MEDICARE

## 2018-11-28 DIAGNOSIS — Z950 Presence of cardiac pacemaker: Secondary | ICD-10-CM

## 2018-11-29 ENCOUNTER — Encounter: Admit: 2018-11-29 | Discharge: 2018-11-29 | Payer: MEDICARE

## 2018-11-29 DIAGNOSIS — Z950 Presence of cardiac pacemaker: Secondary | ICD-10-CM

## 2018-11-29 DIAGNOSIS — I4510 Unspecified right bundle-branch block: Secondary | ICD-10-CM

## 2018-11-29 DIAGNOSIS — I480 Paroxysmal atrial fibrillation: Secondary | ICD-10-CM

## 2018-12-04 ENCOUNTER — Encounter: Admit: 2018-12-04 | Discharge: 2018-12-04 | Payer: MEDICARE

## 2018-12-04 ENCOUNTER — Ambulatory Visit: Admit: 2018-12-04 | Discharge: 2018-12-04 | Payer: MEDICARE

## 2018-12-04 DIAGNOSIS — K219 Gastro-esophageal reflux disease without esophagitis: Secondary | ICD-10-CM

## 2018-12-04 DIAGNOSIS — E119 Type 2 diabetes mellitus without complications: Secondary | ICD-10-CM

## 2018-12-04 DIAGNOSIS — R0600 Dyspnea, unspecified: Secondary | ICD-10-CM

## 2018-12-04 DIAGNOSIS — E785 Hyperlipidemia, unspecified: Secondary | ICD-10-CM

## 2018-12-04 DIAGNOSIS — Z950 Presence of cardiac pacemaker: Secondary | ICD-10-CM

## 2018-12-04 DIAGNOSIS — I10 Essential (primary) hypertension: Secondary | ICD-10-CM

## 2018-12-06 ENCOUNTER — Encounter: Admit: 2018-12-06 | Discharge: 2018-12-06 | Payer: MEDICARE

## 2018-12-06 DIAGNOSIS — K219 Gastro-esophageal reflux disease without esophagitis: Secondary | ICD-10-CM

## 2018-12-06 DIAGNOSIS — E785 Hyperlipidemia, unspecified: Secondary | ICD-10-CM

## 2018-12-06 DIAGNOSIS — R0600 Dyspnea, unspecified: Secondary | ICD-10-CM

## 2018-12-06 DIAGNOSIS — I10 Essential (primary) hypertension: Secondary | ICD-10-CM

## 2018-12-06 DIAGNOSIS — E119 Type 2 diabetes mellitus without complications: Secondary | ICD-10-CM

## 2019-02-26 ENCOUNTER — Ambulatory Visit: Admit: 2019-02-26 | Discharge: 2019-02-26 | Payer: MEDICARE

## 2019-02-26 DIAGNOSIS — I480 Paroxysmal atrial fibrillation: Secondary | ICD-10-CM

## 2019-02-26 DIAGNOSIS — I4510 Unspecified right bundle-branch block: Secondary | ICD-10-CM

## 2019-02-26 DIAGNOSIS — Z950 Presence of cardiac pacemaker: Secondary | ICD-10-CM

## 2019-04-12 ENCOUNTER — Encounter: Admit: 2019-04-12 | Discharge: 2019-04-12 | Payer: MEDICARE

## 2019-04-12 NOTE — Progress Notes
Date of Service: 04/12/2019    Mark Robbins is a 79 y.o. male.       HPI     Mark Robbins is a 79 year old male, he does have a history of coronary artery disease, he underwent surgical revascularization in November 2004 [facility] surgery to PDA, reverse SVG to RI, OM1, OM 2 and OM 3), patient has done well ever since and has not had recurrent symptoms of angina.      A perfusion imaging study was last performed in July 2020, it was mildly abnormal and demonstrated mostly fixed defect in the inferior wall with a normal LV systolic function and an ejection fraction of 70%.      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    Patient is known to have permanent atrial fibrillation, he is anticoagulated with apixaban.    Due to symptoms of lightheadedness and dizziness he was evaluated with a 14 days Holter monitor in January 2020, patient was found to have several pauses, the longest lasted 3.4 seconds, the others lasted approximately 3 seconds.  Patient was seen by Dr. Doyce Loose in the office.  He underwent device therapy in October 2020.  Patient was able to tolerate this procedure very well and he was discharged home in stable condition.    [name] is here today for a follow-up office visit, he reports doing well, he does not have symptoms of chest pain, no heart palpitations, no presyncope or syncope.         Vitals:    04/12/19 1254   BP: 120/84   BP Source: Arm, Left Upper   Patient Position: Sitting   Pulse: 86   SpO2: 97%   Weight: 89.2 kg (196 lb 9.6 oz)   Height: 1.778 m (5' 10)   PainSc: Zero     Body mass index is 28.21 kg/m?Mark Robbins     Past Medical History  Patient Active Problem List    Diagnosis Date Noted   ? Pacemaker 11/11/2018     11/10/2018 s/p successful dual-chamber pacemaker (left-sided, cephalic x2,[employer]onic) by Dr. Wallene Huh     ? 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 Wallene Huh     ? Abnormal Holter monitor finding 11/01/2018   ? 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 Other microscopic hematuria 05/29/2018   ? Abnormal thallium stress test 03/21/2018   ? Atrial fibrillation with slow ventricular response (HCC) 03/21/2018     03/02/2018 - [facility]io Patch:  Atrial fibrillation occurred 100% of the time.  One episode of nonsustained VT occurred it lasted 1.4 seconds with an average heart rate of 159 bpm.  Several pauses occurred, the longest lasted 3.4 seconds on 03/12/2018 at 12:36 PM, other pauses were also present and they all lasted between 3-3.4 seconds, they did occur during wake hours.  There are also episodes of faster atrial fibrillation with a heart rate up to 137 bpm, however slow atrial fibrillation also occurred.  Atrial flutter did not occur.     ? On apixaban therapy 03/21/2018   ? 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 SOB (shortness of breath) 02/06/2018   ? 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     ? CAD (coronary artery disease) 06/26/2013     2004 at [place] by Dr. Farris Has: Coronary Artery Bypass times Five (reverse saphenous vein graft anastomosed to the posterior descending artery, reverse saphenous vein graft anastomosed in sequence to the intermediate ramus, first obtuse marginal, second obtuse marginal, and third obtuse marginal arteries).    12/14/2002 Heart Cath:   1. Patent left main artery.  2. Patent left anterior descending artery.  3. First diagonal is 80% proximal stenosis.  4. Left circumflex proximal 60% stenosis and totally occluded distally beyond third obtuse marginal.  a. 80-90% proximal stenosis second obtuse marginal.  b. 60% stenosis in third obtuse marginal.  5. Right coronary artery is totally occluded and fills from the septal collateral branches from the left side.  6. Patent posterior descending artery.  7. Diffusely occluded posterolateral vessel.  8. Normal left ventricular systolic function with 3+ mitral regurgitation    03/02/2018 - MPI:  This study is abnormal with a mostly fixed but viable defect in the inferior wall.  The very prominent liver activity may lower the sensitivity of the study due to shine through artifact in the inferior wall.  No other definite perfusion defects are present global left rectal function is within normal limits.  There is mild abnormal septal motion present.    09/19/2018 - ECHO:  Left ventricular systolic function is within normal limits.  LVEF 65%.  Mildly dilated right ventricle with mildly depressed right ventricular systolic function.  TAPSE is 1.4 cm.  Moderate left atrial enlargement.  Mild to moderate mitral regurgitation.  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  Estimated peak systolic pulmonary artery pressure is 37 mmHg.       ? Hypertension 06/26/2013   ? GERD (gastroesophageal reflux disease) 06/26/2013   ? Hyperlipemia 06/26/2013   ? Diabetes mellitus (HCC) 06/26/2013         Review of Systems   Constitution: Negative.   HENT: Negative.    Eyes: Negative.    Cardiovascular: Negative.    Respiratory: Negative.    Endocrine: Negative.    Hematologic/Lymphatic: Negative.    Skin: Negative.    Musculoskeletal: Negative.    Gastrointestinal: Negative.    Genitourinary: Negative.    Neurological: Negative.    Psychiatric/Behavioral: Negative.    Allergic/Immunologic: Negative.        Physical Exam  General Appearance: normal in appearance  Skin: warm, moist, no ulcers or xanthomas  Eyes: conjunctivae and lids normal, pupils are equal and round  Lips & Oral Mucosa: no pallor or cyanosis  Neck Veins: neck veins are flat, neck veins are not distended  Chest Inspection: chest is normal in appearance  Respiratory Effort: breathing comfortably, no respiratory distress  Auscultation/Percussion: lungs clear to auscultation, no rales or rhonchi, no wheezing  Cardiac Rhythm: irregularly irregular and normal rate  Cardiac Auscultation: S1, S2 normal, no rub, no gallop  Murmurs: no murmur  Carotid Arteries: normal carotid upstroke bilaterally, no bruit  Lower Extremity Edema: no lower extremity edema  Abdominal Exam: soft, non-tender, no masses, bowel sounds normal  Liver & Spleen: no organomegaly  Language and Memory: patient responsive and seems to comprehend information  Neurologic Exam: neurological assessment grossly intact      Cardiovascular Studies      Problems Addressed Today  Encounter Diagnoses  Name Primary?   ? Coronary artery disease involving native coronary artery of native heart without angina pectoris Yes   ? Essential hypertension    ? Pure hypercholesterolemia    ? Hx of CABG    ? RBBB    ? Atrial fibrillation with slow ventricular response (HCC)    ? Tachy-brady syndrome (HCC)    ? Diabetes mellitus due to underlying condition without complication, without long-term current use of insulin (HCC)    ? SOB (shortness of breath)    ? On apixaban therapy    ? Abnormal Holter monitor finding    ? 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  Patient currently is doing well from a cardiac standpoint.    An echocardiogram performed in August 2020 did reveal mild to moderate mitral valve regurgitation and a dilated proximal ascending aorta.    Plan:    1.  Continue all current medications  2.  I did advise a low-sodium diet  3.  2D echo Doppler study and follow-up with me in approximately 6 months, the echocardiogram will be performed prior to the next office visit.         Current Medications (including today's revisions)  ? amLODIPine (NORVASC) 10 mg tablet Take 10 mg by mouth daily.   ? apixaban (ELIQUIS) 5 mg tablet Take one tablet by mouth twice daily.   ? clobetasol (TEMOVATE) 0.05 % topical ointment Apply  to affected area twice daily.   ? finasteride (PROSCAR) 5 mg tablet Take 5 mg by mouth daily.   ? hydrochlorothiazide (HYDRODIURIL) 25 mg tablet Take 25 mg by mouth daily.   ? Hydrocortisone-Pramoxine (ANALPRAM-HC) 2.5-1 % crea USE SMALL AMOUNT INTO RECTUM TWO TIMES A DAY AS NEEDED FOR RECTAL SYMPTOMS   ? meclizine (ANTIVERT) 12.5 mg tablet Take 12.5 mg by mouth three times daily as needed.   ? meloxicam (MOBIC) 15 mg tablet Take 15 mg by mouth daily.   ? metFORMIN (GLUCOPHAGE) 850 mg tablet Take 850 mg by mouth twice daily with meals.   ? metoprolol tartrate (LOPRESSOR) 25 mg tablet Take one tablet by mouth twice daily.   ? nitroglycerin (NITROSTAT) 0.4 mg tablet Place 0.4 mg under tongue every 5 minutes as needed for Chest Pain.   ? omeprazole DR(+) (PRILOSEC) 20 mg capsule Take 20 mg by mouth daily.   ? potassium chloride SR (K-DUR) 20 mEq tablet Take 30 mEq by mouth daily. Take with a meal and a full glass of water.   ? pravastatin (PRAVACHOL) 80 mg tablet Take 40 mg by mouth at bedtime daily.   ? tamsulosin (FLOMAX) 0.4 mg capsule Take 0.4 mg by mouth daily.

## 2019-08-27 ENCOUNTER — Encounter: Admit: 2019-08-27 | Discharge: 2019-08-27 | Payer: MEDICARE

## 2019-10-18 ENCOUNTER — Encounter: Admit: 2019-10-18 | Discharge: 2019-10-18 | Payer: MEDICARE

## 2019-10-18 DIAGNOSIS — I4891 Unspecified atrial fibrillation: Secondary | ICD-10-CM

## 2019-10-18 DIAGNOSIS — I10 Essential (primary) hypertension: Secondary | ICD-10-CM

## 2019-10-18 DIAGNOSIS — I2510 Atherosclerotic heart disease of native coronary artery without angina pectoris: Secondary | ICD-10-CM

## 2019-10-18 NOTE — Telephone Encounter
-----   Message from Alfredia Client, LPN sent at 0/96/0454 11:36 AM CDT -----  Regarding: Echo per [facility]  Patient needing Echo ordered per [facility] and scheduled at [facility] before Tuesday 10/23/19. Patient has follow up with [facility] in [place] next Tuesday 10/23/2019. Thank you!!

## 2019-10-18 NOTE — Progress Notes
Records Request    Medical records request for continuation of care:    Mark Robbins, Mark Robbins  DOB:[birth year]  SSN: [id number]    Patient has appointment on 10/23/2019   with  Dr. Nickolas Madrid* .    Please fax records to Cardiovascular Medicine [facility] [phone]    Request records: STAT              Recent Labs (CBC, CMP, Lipid Panel)                  Thank you,      Cardiovascular Medicine  [facility]  [address]  [place], [place] [address]  Phone:  [phone]  Fax:  [phone]

## 2019-10-19 ENCOUNTER — Encounter: Admit: 2019-10-19 | Discharge: 2019-10-19 | Payer: MEDICARE

## 2019-10-22 ENCOUNTER — Encounter: Admit: 2019-10-22 | Discharge: 2019-10-22 | Payer: MEDICARE

## 2019-10-22 ENCOUNTER — Ambulatory Visit: Admit: 2019-10-22 | Discharge: 2019-10-22 | Payer: MEDICARE

## 2019-10-23 ENCOUNTER — Encounter: Admit: 2019-10-23 | Discharge: 2019-10-23 | Payer: MEDICARE

## 2019-10-23 DIAGNOSIS — R9431 Abnormal electrocardiogram [ECG] [EKG]: Secondary | ICD-10-CM

## 2019-10-23 DIAGNOSIS — E7800 Pure hypercholesterolemia, unspecified: Secondary | ICD-10-CM

## 2019-10-23 DIAGNOSIS — E119 Type 2 diabetes mellitus without complications: Secondary | ICD-10-CM

## 2019-10-23 DIAGNOSIS — Z7901 Long term (current) use of anticoagulants: Secondary | ICD-10-CM

## 2019-10-23 DIAGNOSIS — I4891 Unspecified atrial fibrillation: Secondary | ICD-10-CM

## 2019-10-23 DIAGNOSIS — I10 Essential (primary) hypertension: Secondary | ICD-10-CM

## 2019-10-23 DIAGNOSIS — E089 Diabetes mellitus due to underlying condition without complications: Secondary | ICD-10-CM

## 2019-10-23 DIAGNOSIS — I2510 Atherosclerotic heart disease of native coronary artery without angina pectoris: Secondary | ICD-10-CM

## 2019-10-23 DIAGNOSIS — R9439 Abnormal result of other cardiovascular function study: Secondary | ICD-10-CM

## 2019-10-23 DIAGNOSIS — K219 Gastro-esophageal reflux disease without esophagitis: Secondary | ICD-10-CM

## 2019-10-23 DIAGNOSIS — R0600 Dyspnea, unspecified: Secondary | ICD-10-CM

## 2019-10-23 DIAGNOSIS — I4510 Unspecified right bundle-branch block: Secondary | ICD-10-CM

## 2019-10-23 DIAGNOSIS — R001 Bradycardia, unspecified: Secondary | ICD-10-CM

## 2019-10-23 DIAGNOSIS — E785 Hyperlipidemia, unspecified: Secondary | ICD-10-CM

## 2019-10-23 DIAGNOSIS — Z950 Presence of cardiac pacemaker: Secondary | ICD-10-CM

## 2019-10-23 DIAGNOSIS — Z951 Presence of aortocoronary bypass graft: Secondary | ICD-10-CM

## 2019-10-23 DIAGNOSIS — I495 Sick sinus syndrome: Secondary | ICD-10-CM

## 2019-10-23 NOTE — Progress Notes
Date of Service: 10/23/2019    Mark Robbins is a 79 y.o. male.       HPI      Mark Robbins is here today for a follow-up office visit.  He is a 79 year old white male with a history of CAD, patient did undergo CABG November 2004, he received a reverse SVG to PDA, reverse SVG to RI, OM1, OM 2 and OM 3.  Patient was evaluated with a perfusion imaging study in January 2020, the tomographic pattern was abnormal with a mostly fixed defect in the inferior wall, ejection fraction was normal, it was 70%.    Patient did develop symptomatic bradycardia, he did undergo device placement on 11/10/2018.  Due to history of atrial fibrillation he remains anticoagulated with apixaban.    Patient was evaluated with a 2D echo Doppler study on 10/22/2019, it demonstrated normal systolic function, normal right ventricular size and function, severe left atrial enlargement, normal pulmonary artery pressure, moderate mitral valve regurgitation and mild tricuspid valve regurgitation.    Patient reports doing well from a cardiac standpoint without any symptoms of chest pain, no heart palpitations, no presyncope or syncope.          Vitals:    10/23/19 1247 10/23/19 1257   BP: 120/78 114/66   BP Source: Arm, Left Upper Arm, Right Upper   Patient Position: Sitting Sitting   Pulse: 78    SpO2: 97%    Weight: 89.1 kg (196 lb 6.4 oz)    Height: 1.778 m (5' 10)    PainSc: Five      Body mass index is 28.18 kg/m?[name]     Past Medical History  Patient Active Problem List    Diagnosis Date Noted   ? Pacemaker 11/11/2018     11/10/2018 s/p successful dual-chamber pacemaker (left-sided, cephalic x2,Medtronic) by Dr. Wallene Huh     ? 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 Wallene Huh     ? Abnormal Holter monitor finding 11/01/2018   ? 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 Other microscopic hematuria 05/29/2018   ? Abnormal thallium stress test 03/21/2018   ? Atrial fibrillation with slow ventricular response (HCC) 03/21/2018     03/02/2018 - Zio Patch:  Atrial fibrillation occurred 100% of the time.  One episode of nonsustained VT occurred it lasted 1.4 seconds with an average heart rate of 159 bpm.  Several pauses occurred, the longest lasted 3.4 seconds on 03/12/2018 at 12:36 PM, other pauses were also present and they all lasted between 3-3.4 seconds, they did occur during wake hours.  There are also episodes of faster atrial fibrillation with a heart rate up to 137 bpm, however slow atrial fibrillation also occurred.  Atrial flutter did not occur.     ? On apixaban therapy 03/21/2018   ? 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 SOB (shortness of breath) 02/06/2018   ? 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     ? CAD (coronary artery disease) 06/26/2013     2004 at [place] by Dr. Farris Has: Coronary Artery Bypass times Five (reverse saphenous vein graft anastomosed to the posterior descending artery, reverse saphenous vein graft anastomosed in sequence to the intermediate ramus, first obtuse marginal, second obtuse marginal, and third obtuse marginal arteries).  12/14/2002 Heart Cath:   1. Patent left main artery.  2. Patent left anterior descending artery.  3. First diagonal is 80% proximal stenosis.  4. Left circumflex proximal 60% stenosis and totally occluded distally beyond third obtuse marginal.  a. 80-90% proximal stenosis second obtuse marginal.  b. 60% stenosis in third obtuse marginal.  5. Right coronary artery is totally occluded and fills from the septal collateral branches from the left side.  6. Patent posterior descending artery.  7. Diffusely occluded posterolateral vessel.  8. Normal left ventricular systolic function with 3+ mitral regurgitation    03/02/2018 - MPI:  This study is abnormal with a mostly fixed but viable defect in the inferior wall.  The very prominent liver activity may lower the sensitivity of the study due to shine through artifact in the inferior wall.  No other definite perfusion defects are present global left rectal function is within normal limits.  There is mild abnormal septal motion present.    09/19/2018 - ECHO:  Left ventricular systolic function is within normal limits.  LVEF 65%.  Mildly dilated right ventricle with mildly depressed right ventricular systolic function.  TAPSE is 1.4 cm.  Moderate left atrial enlargement.  Mild to moderate mitral regurgitation.  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  Estimated peak systolic pulmonary artery pressure is 37 mmHg.       ? Hypertension 06/26/2013   ? GERD (gastroesophageal reflux disease) 06/26/2013   ? Hyperlipemia 06/26/2013   ? Diabetes mellitus (HCC) 06/26/2013         Review of Systems   Constitutional: Negative.   HENT: Positive for tinnitus.    Eyes: Negative.    Cardiovascular: Negative.    Respiratory: Negative.    Endocrine: Negative.    Hematologic/Lymphatic: Bruises/bleeds easily.   Skin: Negative.    Musculoskeletal: Positive for gout.   Gastrointestinal: Negative.    Genitourinary: Negative.    Neurological: Positive for vertigo.   Psychiatric/Behavioral: Negative.    Allergic/Immunologic: Negative.        Physical Exam      Cardiovascular Studies      Problems Addressed Today  Encounter Diagnoses   Name Primary?   ? Atrial fibrillation with slow ventricular response (HCC) Yes   ? Coronary artery disease involving native coronary artery of native heart without angina pectoris    ? Hx of CABG    ? Pure hypercholesterolemia    ? Essential hypertension    ? RBBB    ? Tachy-brady syndrome (HCC)    ? Abnormal Holter monitor finding    ? Abnormal thallium stress test    ? Diabetes mellitus due to underlying condition without complication, without long-term current use of insulin (HCC)    ? Pacemaker    ? Symptomatic bradycardia        Assessment and Plan     In summary: Patient is a 79 year old white male with a history of stable CAD, patient did undergo CABG in November 2004, he does have permanent atrial fibrillation, he is anticoagulated with apixaban, patient did develop symptomatic bradycardia and pauses, he did undergo device placement in October 2020.    Patient remained asymptomatic from a heart standpoint, his compliant with his medical regimen.    The most recent echocardiogram dated 10/22/2019 demonstrated normal systolic function, moderate MR, this does not represent a significant progression of valvular disorder since a previous echocardiogram performed in August 2020.    Plan:    1.  Continue all current medications  2.  Continue risk factors modification  3.  Follow-up office visit in approximately 8 to 10 months.         Current Medications (including today's revisions)  ? amLODIPine (NORVASC) 10 mg tablet Take 10 mg by mouth daily.   ? apixaban (ELIQUIS) 5 mg tablet Take one tablet by mouth twice daily.   ? clobetasol (TEMOVATE) 0.05 % topical ointment Apply  to affected area twice daily.   ? finasteride (PROSCAR) 5 mg tablet Take 5 mg by mouth daily.   ? hydrochlorothiazide (HYDRODIURIL) 25 mg tablet Take 25 mg by mouth daily.   ? Hydrocortisone-Pramoxine (ANALPRAM-HC) 2.5-1 % crea USE SMALL AMOUNT INTO RECTUM TWO TIMES A DAY AS NEEDED FOR RECTAL SYMPTOMS   ? meclizine (ANTIVERT) 12.5 mg tablet Take 12.5 mg by mouth three times daily as needed.   ? meloxicam (MOBIC) 15 mg tablet Take 15 mg by mouth daily.   ? metFORMIN (GLUCOPHAGE) 850 mg tablet Take 850 mg by mouth twice daily with meals.   ? metoprolol tartrate (LOPRESSOR) 25 mg tablet Take one tablet by mouth twice daily.   ? nitroglycerin (NITROSTAT) 0.4 mg tablet Place 0.4 mg under tongue every 5 minutes as needed for Chest Pain.   ? omeprazole DR(+) (PRILOSEC) 20 mg capsule Take 20 mg by mouth daily.   ? potassium chloride SR (K-DUR) 20 mEq tablet Take 30 mEq by mouth daily. Take with a meal and a full glass of water.   ? pravastatin (PRAVACHOL) 80 mg tablet Take 40 mg by mouth at bedtime daily.   ? tamsulosin (FLOMAX) 0.4 mg capsule Take 0.4 mg by mouth daily.

## 2019-11-13 ENCOUNTER — Encounter: Admit: 2019-11-13 | Discharge: 2019-11-13 | Payer: MEDICARE

## 2019-11-28 ENCOUNTER — Encounter: Admit: 2019-11-28 | Discharge: 2019-11-28 | Payer: MEDICARE

## 2019-11-29 ENCOUNTER — Encounter: Admit: 2019-11-29 | Discharge: 2019-11-29 | Payer: MEDICARE

## 2019-11-29 DIAGNOSIS — R0602 Shortness of breath: Secondary | ICD-10-CM

## 2019-11-29 DIAGNOSIS — Z950 Presence of cardiac pacemaker: Secondary | ICD-10-CM

## 2019-11-29 DIAGNOSIS — I2510 Atherosclerotic heart disease of native coronary artery without angina pectoris: Secondary | ICD-10-CM

## 2019-11-29 DIAGNOSIS — I4510 Unspecified right bundle-branch block: Secondary | ICD-10-CM

## 2019-11-29 NOTE — Telephone Encounter
In office device check scheduled for 11/23 at 1:30pm. Patient is agreeable to care plan and has no questions at this time.

## 2019-11-29 NOTE — Telephone Encounter
-----   Message from Griffith Citron, [place] sent at 11/28/2019  4:09 PM CDT -----  Regarding: due for annual in office PPM check NO ORDERS  100% AF with 68% RV pacing   Annual in office check expected this month please enter orders and follow-up with pt/scheduling ASAP    Thanks,  -Kaylee/Device Team

## 2019-12-25 ENCOUNTER — Ambulatory Visit: Admit: 2019-12-25 | Discharge: 2019-12-25 | Payer: MEDICARE

## 2019-12-25 ENCOUNTER — Encounter: Admit: 2019-12-25 | Discharge: 2019-12-25 | Payer: MEDICARE

## 2019-12-25 DIAGNOSIS — Z950 Presence of cardiac pacemaker: Secondary | ICD-10-CM

## 2019-12-25 DIAGNOSIS — R0602 Shortness of breath: Secondary | ICD-10-CM

## 2019-12-25 DIAGNOSIS — I2510 Atherosclerotic heart disease of native coronary artery without angina pectoris: Secondary | ICD-10-CM

## 2019-12-25 DIAGNOSIS — I4510 Unspecified right bundle-branch block: Secondary | ICD-10-CM

## 2020-02-27 ENCOUNTER — Encounter: Admit: 2020-02-27 | Discharge: 2020-02-27 | Payer: MEDICARE

## 2020-05-26 ENCOUNTER — Encounter: Admit: 2020-05-26 | Discharge: 2020-05-26 | Payer: MEDICARE

## 2020-07-29 ENCOUNTER — Encounter: Admit: 2020-07-29 | Discharge: 2020-07-29 | Payer: MEDICARE

## 2020-07-29 DIAGNOSIS — R9431 Abnormal electrocardiogram [ECG] [EKG]: Secondary | ICD-10-CM

## 2020-07-29 DIAGNOSIS — I10 Essential (primary) hypertension: Secondary | ICD-10-CM

## 2020-07-29 DIAGNOSIS — I4510 Unspecified right bundle-branch block: Secondary | ICD-10-CM

## 2020-07-29 DIAGNOSIS — R0600 Dyspnea, unspecified: Secondary | ICD-10-CM

## 2020-07-29 DIAGNOSIS — I4891 Unspecified atrial fibrillation: Secondary | ICD-10-CM

## 2020-07-29 DIAGNOSIS — I2510 Atherosclerotic heart disease of native coronary artery without angina pectoris: Secondary | ICD-10-CM

## 2020-07-29 DIAGNOSIS — E785 Hyperlipidemia, unspecified: Secondary | ICD-10-CM

## 2020-07-29 DIAGNOSIS — I340 Nonrheumatic mitral (valve) insufficiency: Secondary | ICD-10-CM

## 2020-07-29 DIAGNOSIS — Z7901 Long term (current) use of anticoagulants: Secondary | ICD-10-CM

## 2020-07-29 DIAGNOSIS — E119 Type 2 diabetes mellitus without complications: Secondary | ICD-10-CM

## 2020-07-29 DIAGNOSIS — Z950 Presence of cardiac pacemaker: Secondary | ICD-10-CM

## 2020-07-29 DIAGNOSIS — E7800 Pure hypercholesterolemia, unspecified: Secondary | ICD-10-CM

## 2020-07-29 DIAGNOSIS — I495 Sick sinus syndrome: Secondary | ICD-10-CM

## 2020-07-29 DIAGNOSIS — Z951 Presence of aortocoronary bypass graft: Secondary | ICD-10-CM

## 2020-07-29 DIAGNOSIS — K219 Gastro-esophageal reflux disease without esophagitis: Secondary | ICD-10-CM

## 2020-07-29 NOTE — Progress Notes
Date of Service: 07/29/2020    Mark Robbins is a 80 y.o. male.       HPI     Mark Robbins is a 80 year old white male with a history of currently asymptomatic coronary artery disease, status post CABG in 2004 (SVG to PDA, reverse SVG to ramus intermedius, OM1, OM 2 and OM 3), chronic diastolic heart failure, permanent atrial fibrillation, history of tachycardia/bradycardia syndrome and status post PPM implant in October 2020, moderate mitral valve regurgitation.    Patient does not report experiencing any symptoms of chest pain, no heart palpitations, no presyncope or syncope.    The last PPM check was performed on 05/26/2020 and it was overall unremarkable, the underlying atrial fibrillation burden is 100%.    Patient does not report symptoms of chest pain, no heart palpitations, has not taken any sublingual nitroglycerin.         Vitals:    07/29/20 1421   BP: 130/88   BP Source: Arm, Left Upper   Pulse: 66   SpO2: 98%   O2 Device: None (Room air)   PainSc: Zero   Weight: 85.2 kg (187 lb 12.8 oz)   Height: 177.8 cm (5' 10)     Body mass index is 26.95 kg/m?[name]     Past Medical History  Patient Active Problem List    Diagnosis Date Noted   ? Pacemaker 11/11/2018     11/10/2018 s/p successful dual-chamber pacemaker (left-sided, cephalic x2,[employer]tronic) by Dr. Wallene Huh     ? 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 Wallene Huh     ? Abnormal Holter monitor finding 11/01/2018   ? 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 Other microscopic hematuria 05/29/2018   ? Abnormal thallium stress test 03/21/2018   ? Atrial fibrillation with slow ventricular response (HCC) 03/21/2018     03/02/2018 - Zio Patch:  Atrial fibrillation occurred 100% of the time.  One episode of nonsustained VT occurred it lasted 1.4 seconds with an average heart rate of 159 bpm.  Several pauses occurred, the longest lasted 3.4 seconds on 03/12/2018 at 12:36 PM, other pauses were also present and they all lasted between 3-3.4 seconds, they did occur during wake hours.  There are also episodes of faster atrial fibrillation with a heart rate up to 137 bpm, however slow atrial fibrillation also occurred.  Atrial flutter did not occur.     ? On apixaban therapy 03/21/2018   ? 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 SOB (shortness of breath) 02/06/2018   ? 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     ? CAD (coronary artery disease) 06/26/2013     2004 at [place] by Dr. Farris Has: Coronary Artery Bypass times Five (reverse saphenous vein graft anastomosed to the posterior descending artery, reverse saphenous vein graft anastomosed in sequence to the intermediate ramus, first obtuse marginal, second obtuse marginal, and third obtuse marginal arteries).    12/14/2002 Heart Cath:   1. Patent left main artery.  2. Patent left anterior descending artery.  3. First diagonal is 80% proximal stenosis.  4. Left circumflex proximal 60% stenosis and totally occluded distally beyond third obtuse marginal.  a. 80-90% proximal stenosis second obtuse marginal.  b. 60% stenosis in third obtuse marginal.  5. Right coronary artery  is totally occluded and fills from the septal collateral branches from the left side.  6. Patent posterior descending artery.  7. Diffusely occluded posterolateral vessel.  8. Normal left ventricular systolic function with 3+ mitral regurgitation    03/02/2018 - MPI:  This study is abnormal with a mostly fixed but viable defect in the inferior wall.  The very prominent liver activity may lower the sensitivity of the study due to shine through artifact in the inferior wall.  No other definite perfusion defects are present global left rectal function is within normal limits.  There is mild abnormal septal motion present.    09/19/2018 - ECHO:  Left ventricular systolic function is within normal limits.  LVEF 65%.  Mildly dilated right ventricle with mildly depressed right ventricular systolic function.  TAPSE is 1.4 cm.  Moderate left atrial enlargement.  Mild to moderate mitral regurgitation.  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  Estimated peak systolic pulmonary artery pressure is 37 mmHg.       ? Hypertension 06/26/2013   ? GERD (gastroesophageal reflux disease) 06/26/2013   ? Hyperlipemia 06/26/2013   ? Diabetes mellitus (HCC) 06/26/2013         Review of Systems   Constitutional: Negative.   HENT: Negative.    Eyes: Negative.    Cardiovascular: Negative.    Respiratory: Negative.    Endocrine: Negative.    Hematologic/Lymphatic: Negative.    Skin: Negative.    Musculoskeletal: Negative.    Gastrointestinal: Negative.    Genitourinary: Negative.    Neurological: Negative.    Psychiatric/Behavioral: Negative.    Allergic/Immunologic: Negative.        Physical Exam  General Appearance: normal in appearance  Skin: warm, moist, no ulcers or xanthomas  Eyes: conjunctivae and lids normal, pupils are equal and round  Lips & Oral Mucosa: no pallor or cyanosis  Neck Veins: neck veins are flat, neck veins are not distended  Chest Inspection: chest is normal in appearance  Respiratory Effort: breathing comfortably, no respiratory distress  Auscultation/Percussion: lungs clear to auscultation, no rales or rhonchi, no wheezing  Cardiac Rhythm: irregularly irregular rhythm and normal rate  Cardiac Auscultation: S1, S2 normal, no rub, no gallop  Murmurs: no murmur  Carotid Arteries: normal carotid upstroke bilaterally, no bruit  Abdominal Aorta: no abdominal aortic bruit  Lower Extremity Edema: no lower extremity edema  Abdominal Exam: soft, non-tender, no masses, bowel sounds normal  Liver & Spleen: no organomegaly  Neurologic Exam: neurological assessment grossly intact         Cardiovascular Studies  Twelve-lead EKG demonstrates atrial fibrillation, V paced rhythm, ventricular rate 72 bpm    Cardiovascular Health Factors  Vitals BP Readings from Last 3 Encounters:   07/29/20 130/88   10/23/19 114/66   10/22/19 (!) 144/80     Wt Readings from Last 3 Encounters:   07/29/20 85.2 kg (187 lb 12.8 oz)   10/23/19 89.1 kg (196 lb 6.4 oz)   10/22/19 90.3 kg (199 lb)     BMI Readings from Last 3 Encounters:   07/29/20 26.95 kg/m?   10/23/19 28.18 kg/m?   10/22/19 28.55 kg/m?      Smoking Social History     Tobacco Use   Smoking Status Former Smoker   Smokeless Tobacco Never Used      Lipid Profile Cholesterol   Date Value Ref Range Status   04/27/2019 103  Final     HDL   Date  Value Ref Range Status   04/27/2019 31 (L) >40 Final     LDL   Date Value Ref Range Status   04/27/2019 57.2  Final     Triglycerides   Date Value Ref Range Status   04/27/2019 74  Final      Blood Sugar Hemoglobin A1C   Date Value Ref Range Status   03/10/2018 6.1 (H) 4.0 - 6.0 Final     Glucose   Date Value Ref Range Status   06/22/2019 146 (H) 72 - 99 Final   11/11/2018 142 (H) 70 - 100 MG/DL Final   96/29/5284 132 (H) 70 - 100 MG/DL Final   44/02/270 536 (H) 70 - 110 MG/DL Final   64/40/3474 259 (H) 70 - 110 MG/DL Final   56/38/7564 332 (H) 70 - 110 MG/DL Final     Glucose, POC   Date Value Ref Range Status   11/10/2018 147 (H) 70 - 100 MG/DL Final          Problems Addressed Today  Encounter Diagnoses   Name Primary?   ? Atrial fibrillation with slow ventricular response (HCC) Yes   ? Coronary artery disease involving native coronary artery of native heart without angina pectoris    ? Hx of CABG    ? Pure hypercholesterolemia    ? Primary hypertension    ? RBBB    ? Tachy-brady syndrome (HCC)    ? Abnormal Holter monitor finding    ? On apixaban therapy    ? Pacemaker    ? Moderate mitral regurgitation        Assessment:        1.  Coronary artery disease  2.  History of CABG-this was performed in 2004, patient received an SVG to PDA, sequential SVG to OM1/OM 2/OM 3, currently patient does not have any symptoms of angina.    ? He was evaluated with a perfusion imaging study in January 2020, it demonstrated a mostly fixed in farrier wall perfusion abnormality, ejection fraction 70%, this perfusion already was compatible with previous injury, due to lack of symptoms a left heart catheterization was not performed  3.  Tachycardia-bradycardia syndrome-this was detected on a previous Holter monitor  4.  Status post PPM implant in October 2020-normal function  5.  Permanent atrial fibrillation-patient's heart rate is controlled and he is anticoagulated with apixaban  6.  Primary hypertension- under good control  7.  Hyperlipidemia-patient is on statin therapy, pravastatin  8.  Diabetes mellitus type 2-the most recent hemoglobin A1c is unknown  9.  Osteoarthritis-patient uses a cane for ambulation, he did undergo previous knee surgery  10.  Moderate mitral valve regurgitation-this was detected on the last echocardiogram performed in September 2021    Plan:    1.  Continue all current cardiac medications  3.  Continue pacemaker check every 3 months from home and once a year in the office  4.  Further evaluation with a 2D echo Doppler study in November - December 2022-we will follow-up on the results of these tests and will make further recommendations  5.  Follow-up office visit in 10 to 12 months.      Total Time Today was 30 minutes in the following activities: Preparing to see the patient, Obtaining and/or reviewing separately obtained history, Performing a medically appropriate examination and/or evaluation, Counseling and educating the patient/family/caregiver, Ordering medications, tests, or procedures, Referring and communication with other health care professionals (when not separately reported), Documenting clinical information  in the electronic or other health record, Independently interpreting results (not separately reported) and communicating results to the patient/family/caregiver and Care coordination (not separately reported)         Current Medications (including today's revisions)  ? amLODIPine (NORVASC) 10 mg tablet Take 10 mg by mouth daily.   ? apixaban (ELIQUIS) 5 mg tablet Take one tablet by mouth twice daily.   ? clobetasol (TEMOVATE) 0.05 % topical ointment Apply  to affected area twice daily.   ? empagliflozin (JARDIANCE) 25 mg tablet Take 25 mg by mouth daily.   ? finasteride (PROSCAR) 5 mg tablet Take 5 mg by mouth daily.   ? hydrochlorothiazide (HYDRODIURIL) 25 mg tablet Take 25 mg by mouth daily.   ? Hydrocortisone-Pramoxine (ANALPRAM-HC) 2.5-1 % crea USE SMALL AMOUNT INTO RECTUM TWO TIMES A DAY AS NEEDED FOR RECTAL SYMPTOMS   ? meclizine (ANTIVERT) 12.5 mg tablet Take 12.5 mg by mouth three times daily as needed.   ? meloxicam (MOBIC) 15 mg tablet Take 15 mg by mouth daily.   ? metFORMIN (GLUCOPHAGE) 850 mg tablet Take 850 mg by mouth twice daily with meals.   ? metoprolol tartrate (LOPRESSOR) 25 mg tablet Take one tablet by mouth twice daily.   ? nitroglycerin (NITROSTAT) 0.4 mg tablet Place 0.4 mg under tongue every 5 minutes as needed for Chest Pain.   ? omeprazole DR(+) (PRILOSEC) 20 mg capsule Take 20 mg by mouth daily.   ? potassium chloride SR (K-DUR) 20 mEq tablet Take 30 mEq by mouth daily. Take with a meal and a full glass of water.   ? pravastatin (PRAVACHOL) 80 mg tablet Take 40 mg by mouth at bedtime daily.   ? tamsulosin (FLOMAX) 0.4 mg capsule Take 0.4 mg by mouth daily.

## 2020-08-26 ENCOUNTER — Encounter: Admit: 2020-08-26 | Discharge: 2020-08-26 | Payer: MEDICARE

## 2020-11-25 ENCOUNTER — Encounter: Admit: 2020-11-25 | Discharge: 2020-11-25 | Payer: MEDICARE

## 2020-12-03 ENCOUNTER — Ambulatory Visit: Admit: 2020-12-03 | Discharge: 2020-12-03 | Payer: MEDICARE

## 2020-12-03 ENCOUNTER — Encounter: Admit: 2020-12-03 | Discharge: 2020-12-03 | Payer: MEDICARE

## 2020-12-03 DIAGNOSIS — I10 Essential (primary) hypertension: Secondary | ICD-10-CM

## 2020-12-03 DIAGNOSIS — I25119 Atherosclerotic heart disease of native coronary artery with unspecified angina pectoris: Secondary | ICD-10-CM

## 2020-12-03 DIAGNOSIS — Z950 Presence of cardiac pacemaker: Secondary | ICD-10-CM

## 2020-12-03 DIAGNOSIS — I4891 Unspecified atrial fibrillation: Secondary | ICD-10-CM

## 2020-12-03 DIAGNOSIS — Z951 Presence of aortocoronary bypass graft: Secondary | ICD-10-CM

## 2020-12-11 ENCOUNTER — Encounter: Admit: 2020-12-11 | Discharge: 2020-12-11 | Payer: MEDICARE

## 2020-12-11 ENCOUNTER — Ambulatory Visit: Admit: 2020-12-11 | Discharge: 2020-12-11 | Payer: MEDICARE

## 2020-12-11 DIAGNOSIS — I4510 Unspecified right bundle-branch block: Secondary | ICD-10-CM

## 2020-12-11 DIAGNOSIS — Z950 Presence of cardiac pacemaker: Secondary | ICD-10-CM

## 2020-12-15 ENCOUNTER — Encounter: Admit: 2020-12-15 | Discharge: 2020-12-15 | Payer: MEDICARE

## 2020-12-15 DIAGNOSIS — E7800 Pure hypercholesterolemia, unspecified: Secondary | ICD-10-CM

## 2020-12-15 DIAGNOSIS — I10 Essential (primary) hypertension: Secondary | ICD-10-CM

## 2020-12-15 DIAGNOSIS — I4891 Unspecified atrial fibrillation: Secondary | ICD-10-CM

## 2020-12-15 NOTE — Telephone Encounter
-----   Message from Dorris Fetch, MD sent at 12/12/2020  9:26 PM CST -----  Please let the patient know that the echocardiogram showed normal heart function.    I will see him back in the office in [place] in 1 year from the last office visit.    Thank you      ----- Message -----  From: Levora Angel, MD  Sent: 12/03/2020   4:16 PM CST  To: Dorris Fetch, MD

## 2020-12-15 NOTE — Telephone Encounter
Results and recommendations called to patient lmom requested call back if questions

## 2021-02-25 ENCOUNTER — Encounter: Admit: 2021-02-25 | Discharge: 2021-02-25 | Payer: MEDICARE

## 2021-02-25 NOTE — Telephone Encounter
Patient called about remote with NSVT episodes.  He states he has not had any symptoms and has been feeling fine.  Routed to [facility] and RAD for review and recommends.

## 2021-02-25 NOTE — Telephone Encounter
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  I dont see noted previously. See remote

## 2021-05-25 ENCOUNTER — Encounter: Admit: 2021-05-25 | Discharge: 2021-05-25 | Payer: MEDICARE

## 2021-06-02 ENCOUNTER — Encounter: Admit: 2021-06-02 | Discharge: 2021-06-02 | Payer: MEDICARE

## 2021-06-02 NOTE — Telephone Encounter
He is having pain in the back of his legs   He follows with [facility]  They want to order him a TENS unit  He has Medtronic PM - [employer] statement : "A TENS device is not recommended for in-home use by cardiac device patients due to a potential for oversensing, inappropriate therapy, or inhibition of pacing. "  His last remote shows he does VP 83%   LVM on his machine regarding above  Gave him call back number if any questions or concerns

## 2021-07-02 ENCOUNTER — Encounter: Admit: 2021-07-02 | Discharge: 2021-07-02 | Payer: MEDICARE

## 2021-07-02 DIAGNOSIS — R0600 Dyspnea, unspecified: Secondary | ICD-10-CM

## 2021-07-02 DIAGNOSIS — R0602 Shortness of breath: Secondary | ICD-10-CM

## 2021-07-02 DIAGNOSIS — Z951 Presence of aortocoronary bypass graft: Secondary | ICD-10-CM

## 2021-07-02 DIAGNOSIS — Z136 Encounter for screening for cardiovascular disorders: Secondary | ICD-10-CM

## 2021-07-02 DIAGNOSIS — I2510 Atherosclerotic heart disease of native coronary artery without angina pectoris: Secondary | ICD-10-CM

## 2021-07-02 DIAGNOSIS — E785 Hyperlipidemia, unspecified: Secondary | ICD-10-CM

## 2021-07-02 DIAGNOSIS — I4891 Unspecified atrial fibrillation: Secondary | ICD-10-CM

## 2021-07-02 DIAGNOSIS — I495 Sick sinus syndrome: Secondary | ICD-10-CM

## 2021-07-02 DIAGNOSIS — R9431 Abnormal electrocardiogram [ECG] [EKG]: Secondary | ICD-10-CM

## 2021-07-02 DIAGNOSIS — E089 Diabetes mellitus due to underlying condition without complications: Secondary | ICD-10-CM

## 2021-07-02 DIAGNOSIS — I340 Nonrheumatic mitral (valve) insufficiency: Secondary | ICD-10-CM

## 2021-07-02 DIAGNOSIS — K219 Gastro-esophageal reflux disease without esophagitis: Secondary | ICD-10-CM

## 2021-07-02 DIAGNOSIS — I4510 Unspecified right bundle-branch block: Secondary | ICD-10-CM

## 2021-07-02 DIAGNOSIS — E7800 Pure hypercholesterolemia, unspecified: Secondary | ICD-10-CM

## 2021-07-02 DIAGNOSIS — Z7901 Long term (current) use of anticoagulants: Secondary | ICD-10-CM

## 2021-07-02 DIAGNOSIS — Z950 Presence of cardiac pacemaker: Secondary | ICD-10-CM

## 2021-07-02 DIAGNOSIS — D582 Other hemoglobinopathies: Secondary | ICD-10-CM

## 2021-07-02 DIAGNOSIS — I10 Essential (primary) hypertension: Secondary | ICD-10-CM

## 2021-07-02 DIAGNOSIS — R001 Bradycardia, unspecified: Secondary | ICD-10-CM

## 2021-07-02 DIAGNOSIS — E119 Type 2 diabetes mellitus without complications: Secondary | ICD-10-CM

## 2021-07-02 NOTE — Progress Notes
Date of Service: 07/02/2021    Mark Robbins is a 81 y.o. male.       HPI     Mark Robbins is a 81 y.o. 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    Patient is doing well from a cardiac standpoint.  Has not experienced any symptoms of chest pain, has not taken sublingual nitroglycerin.    He continues to check his PPM device remotely, the last one is dated 05/25/2021, normal device function.  He is anticoagulated with apixaban for the underlying history of atrial fibrillation.    The last echocardiogram performed in November 2022-normal LVEF = 65%, mild to moderate MR, the LA severely dilated, there were no other significant abnormalities.         Vitals:    07/02/21 1357   BP: 130/78   BP Source: Arm, Left Upper   Pulse: 83   SpO2: 94%   O2 Device: None (Room air)   PainSc: Zero   Weight: 87.2 kg (192 lb 3.2 oz)   Height: 177.8 cm (5' 10)     Body mass index is 27.58 kg/m?[name]     Past Medical History  Patient Active Problem List    Diagnosis Date Noted   ? Moderate mitral regurgitation 07/29/2020   ? Pacemaker 11/11/2018     11/10/2018 s/p successful dual-chamber pacemaker (left-sided, cephalic x2,[employer]) by Dr. Wallene Huh     ? 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 Wallene Huh     ? Abnormal Holter monitor finding 11/01/2018   ? 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 Other microscopic hematuria 05/29/2018   ? Abnormal thallium stress test 03/21/2018   ? Atrial fibrillation with slow ventricular response (HCC) 03/21/2018     03/02/2018 - [facility]:  Atrial fibrillation occurred 100% of the time.  One episode of nonsustained VT occurred it lasted 1.4 seconds with an average heart rate of 159 bpm.  Several pauses occurred, the longest lasted 3.4 seconds on 03/12/2018 at 12:36 PM, other pauses were also present and they all lasted between 3-3.4 seconds, they did occur during wake hours.  There are also episodes of faster atrial fibrillation with a heart rate up to 137 bpm, however slow atrial fibrillation also occurred.  Atrial flutter did not occur.     ? On apixaban therapy 03/21/2018   ? 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 SOB (shortness of breath) 02/06/2018   ? 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     ? CAD (coronary artery disease) 06/26/2013     2004 at [facility] by Dr. Farris Has: Coronary Artery Bypass times Five (reverse saphenous vein graft anastomosed to the posterior descending artery, reverse saphenous vein graft anastomosed in sequence to the intermediate ramus, first obtuse marginal, second obtuse marginal, and third obtuse marginal arteries).    12/14/2002 Heart Cath:   1. Patent left main artery.  2. Patent left anterior descending artery.  3. First diagonal is 80% proximal stenosis.  4. Left circumflex  proximal 60% stenosis and totally occluded distally beyond third obtuse marginal.  a. 80-90% proximal stenosis second obtuse marginal.  b. 60% stenosis in third obtuse marginal.  5. Right coronary artery is totally occluded and fills from the septal collateral branches from the left side.  6. Patent posterior descending artery.  7. Diffusely occluded posterolateral vessel.  8. Normal left ventricular systolic function with 3+ mitral regurgitation    03/02/2018 - MPI:  This study is abnormal with a mostly fixed but viable defect in the inferior wall.  The very prominent liver activity may lower the sensitivity of the study due to shine through artifact in the inferior wall.  No other definite perfusion defects are present global left rectal function is within normal limits.  There is mild abnormal septal motion present.    09/19/2018 - ECHO:  Left ventricular systolic function is within normal limits.  LVEF 65%.  Mildly dilated right ventricle with mildly depressed right ventricular systolic function.  TAPSE is 1.4 cm.  Moderate left atrial enlargement.  Mild to moderate mitral regurgitation.  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  Estimated peak systolic pulmonary artery pressure is 37 mmHg.       ? Hypertension 06/26/2013   ? GERD (gastroesophageal reflux disease) 06/26/2013   ? Hyperlipemia 06/26/2013   ? Diabetes mellitus (HCC) 06/26/2013         Review of Systems   Constitutional: Negative.   HENT: Negative.    Eyes: Negative.    Cardiovascular: Negative.    Respiratory: Negative.    Endocrine: Negative.    Hematologic/Lymphatic: Negative.    Skin: Negative.    Musculoskeletal: Negative.    Gastrointestinal: Negative.    Genitourinary: Negative.    Neurological: Negative.    Psychiatric/Behavioral: Negative.    Allergic/Immunologic: Negative.        Physical Exam  General Appearance: normal in appearance  Skin: warm, moist, no ulcers or xanthomas  Eyes: conjunctivae and lids normal, pupils are equal and round  Lips & Oral Mucosa: no pallor or cyanosis  Neck Veins: neck veins are flat, neck veins are not distended  Chest Inspection: chest is normal in appearance  Respiratory Effort: breathing comfortably, no respiratory distress  Auscultation/Percussion: lungs clear to auscultation, no rales or rhonchi, no wheezing  Cardiac Rhythm: irregularly irregular rhythm and normal rate  Cardiac Auscultation: S1, S2 normal, no rub, no gallop  Murmurs: no murmur  Carotid Arteries: normal carotid upstroke bilaterally, no bruit  Abdominal Aorta: no abdominal aortic bruit  Lower Extremity Edema: no lower extremity edema  Abdominal Exam: soft, non-tender, no masses, bowel sounds normal  Liver & Spleen: no organomegaly  Neurologic Exam: neurological assessment grossly intact      Cardiovascular Studies  Twelve-lead EKG demonstrates V paced rhythm, ventricular rate 73 bpm    Cardiovascular Health Factors  Vitals BP Readings from Last 3 Encounters:   07/02/21 130/78   12/03/20 (!) 146/103   07/29/20 130/88     Wt Readings from Last 3 Encounters:   07/02/21 87.2 kg (192 lb 3.2 oz)   12/03/20 89.8 kg (198 lb)   07/29/20 85.2 kg (187 lb 12.8 oz)     BMI Readings from Last 3 Encounters:   07/02/21 27.58 kg/m?   12/03/20 28.41 kg/m?   07/29/20 26.95 kg/m?      Smoking Social History     Tobacco Use   Smoking Status Former   Smokeless Tobacco Never   Vaping Use   ?  Vaping Use: Never used      Lipid Profile Cholesterol   Date Value Ref Range Status   04/27/2019 103  Final     HDL   Date Value Ref Range Status   04/27/2019 31 (L) >40 Final     LDL   Date Value Ref Range Status   04/27/2019 57.2  Final     Triglycerides   Date Value Ref Range Status   04/27/2019 74  Final      Blood Sugar Hemoglobin A1C   Date Value Ref Range Status   03/10/2018 6.1 (H) 4.0 - 6.0 Final     Glucose   Date Value Ref Range Status   06/22/2019 146 (H) 72 - 99 Final   11/11/2018 142 (H) 70 - 100 MG/DL Final   16/11/9602 540 (H) 70 - 100 MG/DL Final   98/12/9145 829 (H) 70 - 110 MG/DL Final   56/21/3086 578 (H) 70 - 110 MG/DL Final   46/96/2952 841 (H) 70 - 110 MG/DL Final     Glucose, POC   Date Value Ref Range Status   11/10/2018 147 (H) 70 - 100 MG/DL Final          Problems Addressed Today  Encounter Diagnoses   Name Primary?   ? Coronary artery disease involving native coronary artery of native heart without angina pectoris Yes   ? Primary hypertension    ? Pure hypercholesterolemia    ? Hx of CABG    ? RBBB    ? Atrial fibrillation with slow ventricular response (HCC)    ? Tachy-brady syndrome (HCC)    ? Moderate mitral regurgitation    ? Diabetes mellitus due to underlying condition without complication, without long-term current use of insulin (HCC)    ? SOB (shortness of breath)    ? On apixaban therapy    ? Pacemaker    ? Symptomatic bradycardia    ? Abnormal Holter monitor finding    ? Screening for heart disease    ? Elevated hemoglobin (HCC)        Assessment and Plan     Assessment:    1.  Coronary artery disease-no symptoms of angina, no use of sublingual nitroglycerin  2.  CABG-this was performed in 2004, patient received an SVG to PDA, sequential SVG to OM1/OM 2/OM 3, currently patient does not have any symptoms of angina.    ? He was evaluated with a perfusion imaging study in January 2020, it demonstrated a mostly fixed in farrier wall perfusion abnormality, ejection fraction 70%, this perfusion already was compatible with previous injury, due to lack of symptoms a left heart catheterization was not performed  3.  Mild to moderate MR-last evaluation with an echo in November 2022  4.  History of tachycardia/bradycardia syndrome-detected on a Holter monitor  5.  Status post PPM implant in October 2020-normal function  6.  Permanent atrial fibrillation-patient's heart rate is controlled and he is anticoagulated with apixaban  7.  Primary hypertension-well-controlled  8.  Diabetes mellitus type 2  9.  Elevated hemoglobin and hematocrit-?  Polycythemia vera, patient is in the process of being evaluated by a hematologist at the [facility]  10.  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  Continue all current medications  2.  I advised to pursue the appointment to hematology at the [facility]  3.  Overall risk factors modifications  4.  Follow-up office visit in  10 to 12 months, we will also perform another echocardiogram next year to follow-up on the mitral valve regurgitation    Total Time Today was 30 minutes in the following activities: Preparing to see the patient, Obtaining and/or reviewing separately obtained history, Performing a medically appropriate examination and/or evaluation, Counseling and educating the patient/family/caregiver, Ordering medications, tests, or procedures, Referring and communication with other health care professionals (when not separately reported), Documenting clinical information in the electronic or other health record, Independently interpreting results (not separately reported) and communicating results to the patient/family/caregiver and Care coordination (not separately reported)         Current Medications (including today's revisions)  ? acetaminophen (TYLENOL EXTRA STRENGTH) 500 mg tablet Take one tablet by mouth every 6 hours as needed for Pain. Max of 4,000 mg of acetaminophen in 24 hours.   ? apixaban (ELIQUIS) 5 mg tablet Take one tablet by mouth twice daily.   ? celecoxib (CELEBREX) 200 mg capsule Take one capsule by mouth daily.   ? CHOLEcalciferoL (vitamin D3) (VITAMIN D3) 1,000 units tablet Take one tablet by mouth daily.   ? clobetasol (TEMOVATE) 0.05 % topical ointment Apply  to affected area twice daily.   ? Cyanocobalamin (VITAMIN B-12) 1,000 mcg sublingual tablet Place one tablet under tongue daily.   ? diclofenac sodium (VOLTAREN) 1 % topical gel Apply four g topically to affected area four times daily.   ? empagliflozin (JARDIANCE) 25 mg tablet Take one tablet by mouth daily.   ? finasteride (PROSCAR) 5 mg tablet Take one tablet by mouth daily.   ? hydrochlorothiazide (HYDRODIURIL) 25 mg tablet Take one tablet by mouth daily.   ? Hydrocortisone-Pramoxine (ANALPRAM-HC) 2.5-1 % crea USE SMALL AMOUNT INTO RECTUM TWO TIMES A DAY AS NEEDED FOR RECTAL SYMPTOMS   ? lidocaine (LIDODERM) 5 % topical patch Apply one patch topically to affected area daily. Apply patch for 12 hours, then remove for 12 hours before repeating.   ? losartan (COZAAR) 100 mg tablet Take one tablet by mouth daily.   ? meclizine (ANTIVERT) 12.5 mg tablet Take one tablet by mouth three times daily as needed.   ? meloxicam (MOBIC) 15 mg tablet Take one tablet by mouth daily.   ? metFORMIN (GLUCOPHAGE) 850 mg tablet Take one tablet by mouth twice daily with meals.   ? metoprolol tartrate (LOPRESSOR) 25 mg tablet Take one tablet by mouth twice daily.   ? nitroglycerin (NITROSTAT) 0.4 mg tablet Place one tablet under tongue every 5 minutes as needed for Chest Pain.   ? omeprazole DR(+) (PRILOSEC) 20 mg capsule Take one capsule by mouth daily.   ? pravastatin (PRAVACHOL) 80 mg tablet Take one-half tablet by mouth at bedtime daily.   ? tamsulosin (FLOMAX) 0.4 mg capsule Take one capsule by mouth daily.

## 2021-07-30 IMAGING — US ECHOCOMPL
2 series · 12 of 24 positions shown · non-contrast
Comparison: none

[Series 1: us echo 2d, complete · 106 acquisitions, 11 frames shown (1 of 2)]
[im 1/106]
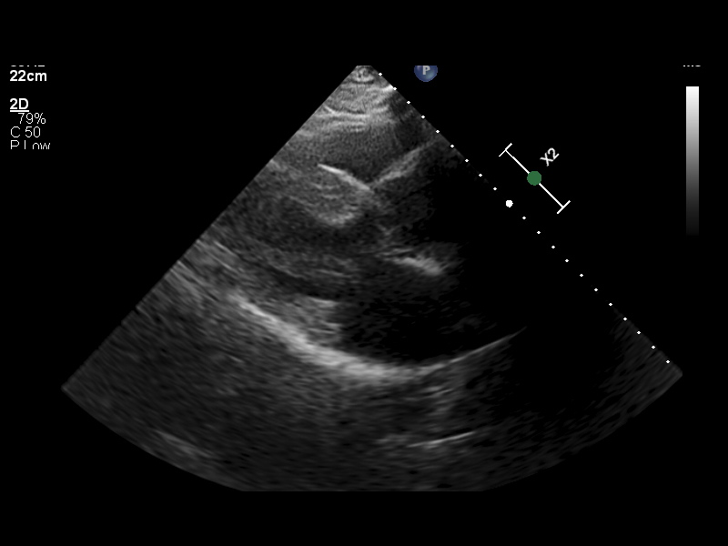
[im 10/106]
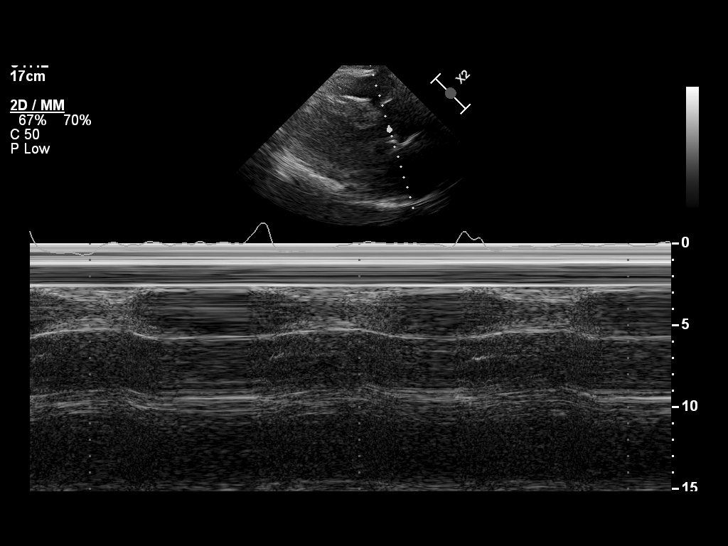
[im 20/106]
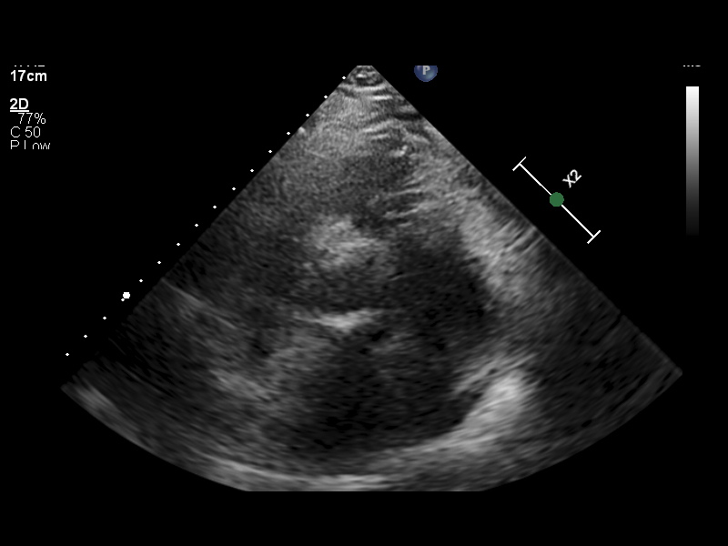
[im 24/106]
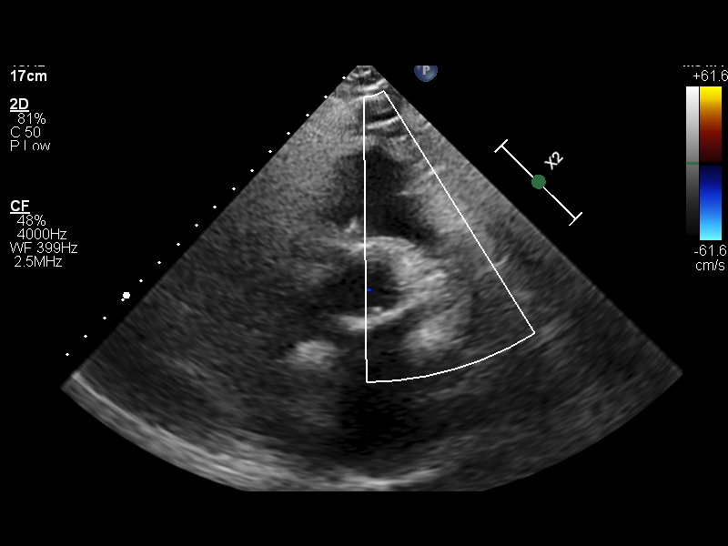
[im 34/106]
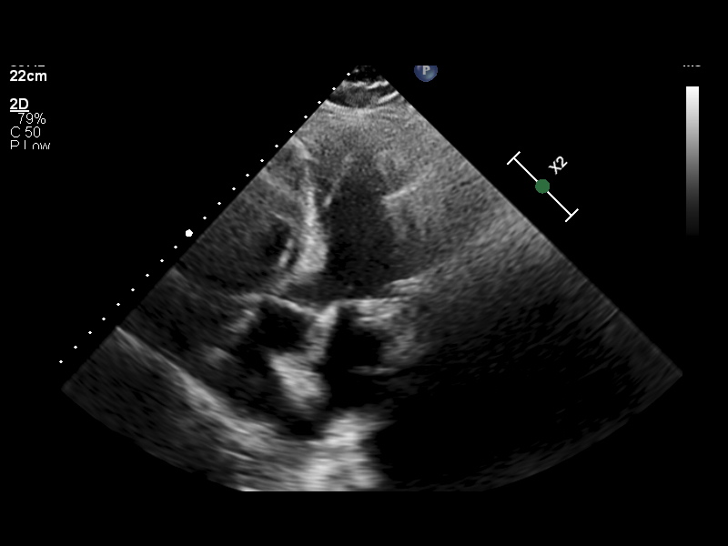
[im 39/106]
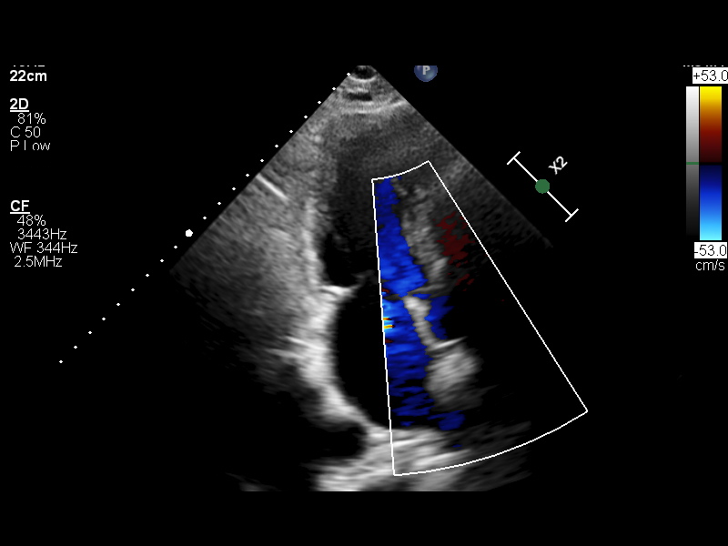
[im 48/106]
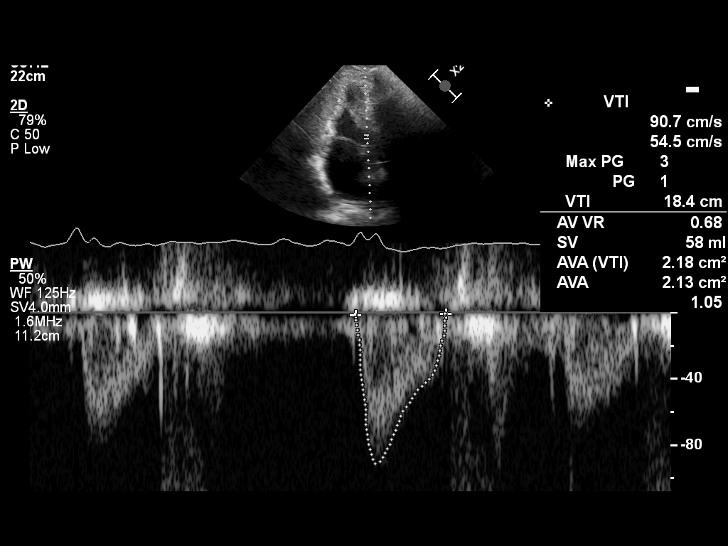
[im 63/106]
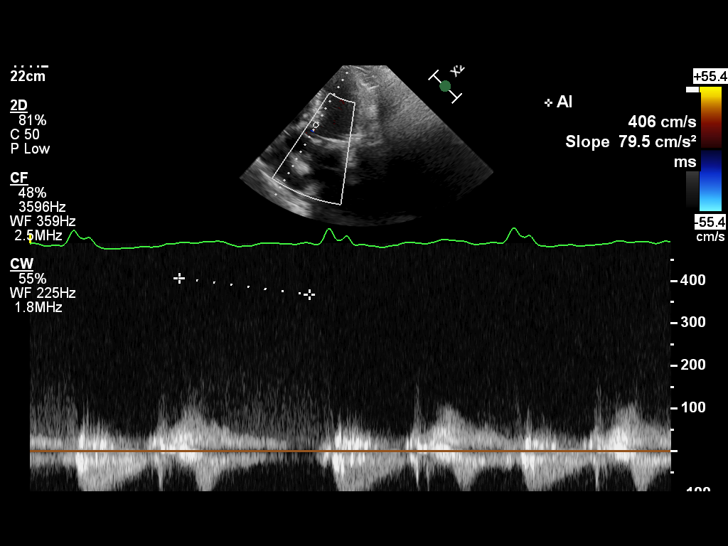
[im 77/106]
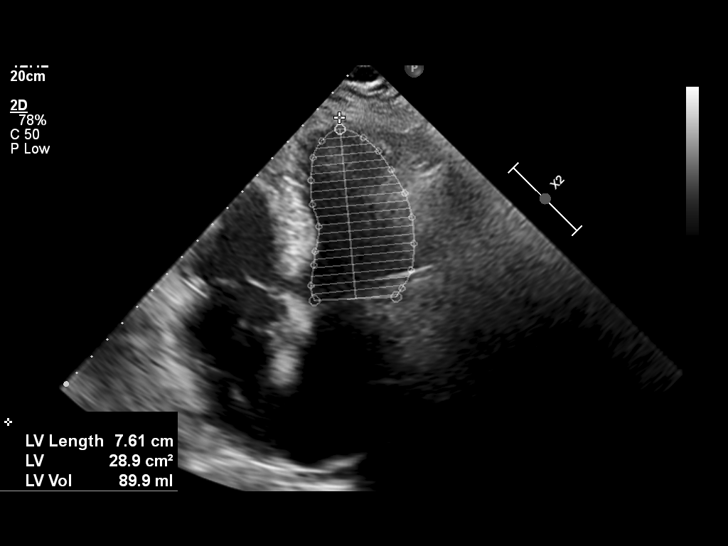
[im 86/106]
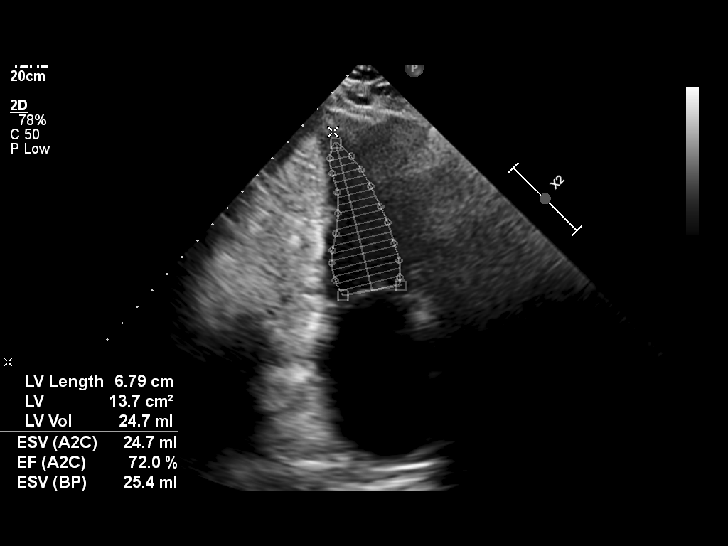
[im 96/106]
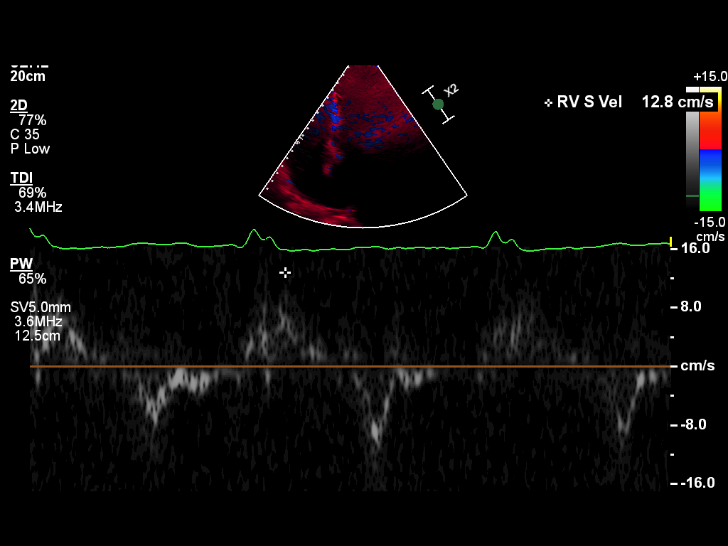

[Series 1: us echo 2d, complete · 1 of 1 slices shown (2 of 2)]
[im 1/1]
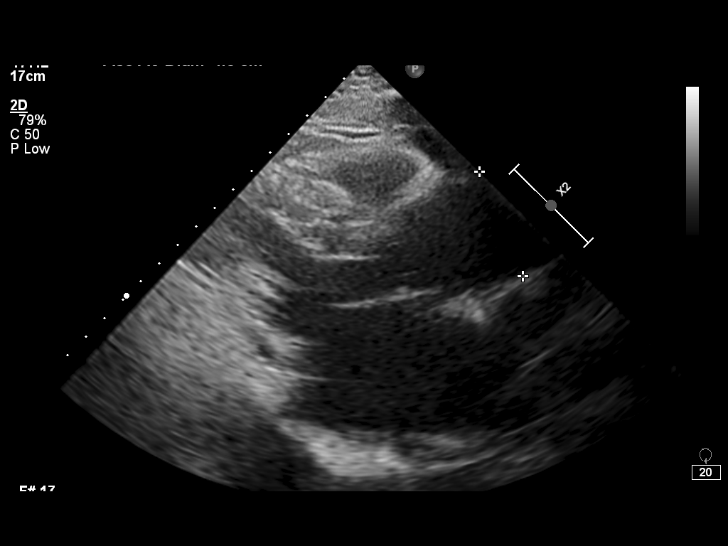

[12 of 24 positions shown; findings below may reference images not displayed]

10/22/19 -  2D + DOPPLER ECHO
Location Performed: [HOSPITAL]

Referring Provider:
Fellow:
Location of Interp:
Sonographer: External Staff

Indications: Atrial fibrillation     Congenital Heart Disease     Hypertension

Technically difficult study.

Vitals
Height   Weight   BSA (Calculated)   BP   Comments
177.8 cm (70")   90.3 kg (199 lb)   2.11   144/80

Interpretation Summary
Normal left ventricular systolic function with an EF of 65%.
Normal right ventricular size and function; RV s' 10 cm/s, TAPSE 17 mm
Severe left atrial enlargement with a left atrial volume index of 59 mL/m²
Estimated peak systolic PA pressure =  32 mmHg
Mild tricuspid valve regurgitation
Aortic valve sclerosis without stenosis.  Peak velocity 1.4 m/s.  No regurgitation.
Moderate mitral valve regurgitation
No pericardial effusion

In comparison to prior study dated 09-19-2018: Aortic valve sclerosis has progressed.  No change in
mitral valve regurgitation.

Echocardiographic Findings
Left Ventricle   The left ventricular size, wall thickness and systolic function are normal. The
visually estimated ejection fraction is 65%. There are no segmental wall motion abnormalities.
Unable to assess left ventricular diastolic function. Unable to assess left atrial pressure.
Right Ventricle   The right ventricular size is normal. The right ventricular systolic function is
normal. Pacemaker lead present in the ventricle.
Left Atrium   Severely dilated.
Right Atrium   Normal size. Pacemaker lead present in the right atrium.
IVC/SVC   Normal central venous pressure (0-5 mm Hg).
Mitral Valve   Non-specific thickening. No stenosis. Moderate regurgitation.
Tricuspid Valve   Normal valve structure. No stenosis. Mild regurgitation.
Aortic Valve   The valve has focal thickening and is sclerotic. No stenosis. Trace regurgitation.
Pulmonary   The pulmonic valve was not seen well but no Doppler evidence of stenosis. No
regurgitation.
Aorta   The aortic root and ascending aorta are normal in size.
Pericardium   No pericardial effusion.

Left Ventricular Wall Scoring
Resting   Score Index: 1.000   Percent Normal: 100.0%

The left ventricular wall motion is normal.

Left Heart 2D Measurements (Normal Ranges)
EF (Visual)
65 %
EF (Simpson's)
71 %
LVIDD
3.9 cm (Range: 4.2 - 5.8)
LVIDS
3.4 cm (Range: 2.5 - 4.0)
IVS
2.0 cm (Range: 0.6 - 1.0)
LV PW
1.6 cm (Range: 0.6 - 1.0)
LA Size
5.3 cm (Range: 3.0 - 4.0)

Right Heart 2D   M-Mode Measurements (Normal Ranges) (Range)
RV Basal Dia
2.5 cm (2.5 - 4.1)
RV Mid Dia
1.9 cm (1.9 - 3.5)
ELMER
17.0 cm2 (<18)
M-Mode TAPSE
1.7 cm (>1.7)

Left Heart 2D Addnl Measurements (Normal Ranges)
LV Systolic Vol
25 mL (Range: 21 - 61)
LV Systolic Vol Index
12 mL (Range: 11 - 31)
LV Diastolic Vol
77 mL (Range: 62 - 150)
LV Diastolic Vol Index
36 mL (Range: 34 - 74)
LA Vol
126 mL (Range: 18 - 58)
LA Vol Index
59.72 (Range: 16 - 34)
LV Mass
302 g (Range: 88 - 224)
LV Mass Index
143 g/m2 (Range: 49 - 115)
RWT
0.82 (Range: <=0.42)

Aortic Root Measurements (Normal Ranges)
Sinus
4.0 cm (Range: 2.8 - 4.0)

Doppler (Spectral and Color Flow)
Estimated Peak Systolic PA Pressure
Aortic valve peak velocity
1.4 m/s

Tech Notes:

TM

## 2021-08-25 ENCOUNTER — Encounter: Admit: 2021-08-25 | Discharge: 2021-08-25 | Payer: MEDICARE

## 2021-08-26 ENCOUNTER — Encounter: Admit: 2021-08-26 | Discharge: 2021-08-26 | Payer: MEDICARE

## 2021-08-26 NOTE — Telephone Encounter
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 Wallene Huh commented on 02/26/21 " asymptomatic short durations NSVT is pretty harmless"  flag to MD nurse pool to see if he had any s/sx.

## 2021-08-26 NOTE — Telephone Encounter
Called and discussed with patient.  He states that he is not having any symptoms or issues at all.  He has been feeling fine.    Will route to [facility] for any additional recommendations.

## 2021-11-24 ENCOUNTER — Encounter: Admit: 2021-11-24 | Discharge: 2021-11-23 | Payer: MEDICARE

## 2022-01-05 ENCOUNTER — Encounter: Admit: 2022-01-05 | Discharge: 2022-01-05 | Payer: MEDICARE

## 2022-01-05 DIAGNOSIS — Z950 Presence of cardiac pacemaker: Secondary | ICD-10-CM

## 2022-01-21 ENCOUNTER — Ambulatory Visit: Admit: 2022-01-21 | Discharge: 2022-01-21 | Payer: MEDICARE

## 2022-01-21 ENCOUNTER — Encounter: Admit: 2022-01-21 | Discharge: 2022-01-21 | Payer: MEDICARE

## 2022-01-21 DIAGNOSIS — I4510 Unspecified right bundle-branch block: Secondary | ICD-10-CM

## 2022-02-24 ENCOUNTER — Encounter: Admit: 2022-02-24 | Discharge: 2022-02-24 | Payer: MEDICARE

## 2022-04-05 ENCOUNTER — Encounter: Admit: 2022-04-05 | Discharge: 2022-04-05 | Payer: MEDICARE

## 2022-04-05 ENCOUNTER — Ambulatory Visit: Admit: 2022-04-05 | Discharge: 2022-04-05 | Payer: MEDICARE

## 2022-04-05 DIAGNOSIS — I2510 Atherosclerotic heart disease of native coronary artery without angina pectoris: Secondary | ICD-10-CM

## 2022-04-06 ENCOUNTER — Encounter: Admit: 2022-04-06 | Discharge: 2022-04-06 | Payer: MEDICARE

## 2022-04-06 NOTE — Progress Notes
Records Request    Medical records request for continuation of care:    Mark Robbins, Mark Robbins   DOB:  1940/02/25  SSN:  [id number]    Patient has appointment on 04/15/2022   with  Dr. Samantha Crimes* .      **Please fax records to [facility]      Request records: STAT                    Recent Labs (CMP, Lipid Panel)                          Thank you,      Cardiovascular Medicine  [facility]  [address] [place] [address]  Phone:  [phone]  Fax:  [phone]

## 2022-04-06 NOTE — Telephone Encounter
Results and recommendations called to patient.

## 2022-04-06 NOTE — Telephone Encounter
-----   Message from Vertell Novak, MD sent at 04/06/2022  4:06 PM CST -----  Please let this patient know that the echocardiogram showed normal heart function, there is some leakage on the valve on the left side, overall, it does not appear to be any significant abnormalities, will continue to follow.    Thank you      ----- Message -----  From: Samuel Jester, MD  Sent: 04/06/2022   9:03 AM CST  To: Vertell Novak, MD

## 2022-04-13 ENCOUNTER — Encounter: Admit: 2022-04-13 | Discharge: 2022-04-13 | Payer: MEDICARE

## 2022-04-15 ENCOUNTER — Encounter: Admit: 2022-04-15 | Discharge: 2022-04-15 | Payer: MEDICARE

## 2022-04-15 DIAGNOSIS — E785 Hyperlipidemia, unspecified: Secondary | ICD-10-CM

## 2022-04-15 DIAGNOSIS — R0600 Dyspnea, unspecified: Secondary | ICD-10-CM

## 2022-04-15 DIAGNOSIS — I4891 Unspecified atrial fibrillation: Secondary | ICD-10-CM

## 2022-04-15 DIAGNOSIS — I340 Nonrheumatic mitral (valve) insufficiency: Secondary | ICD-10-CM

## 2022-04-15 DIAGNOSIS — Z951 Presence of aortocoronary bypass graft: Secondary | ICD-10-CM

## 2022-04-15 DIAGNOSIS — I10 Essential (primary) hypertension: Secondary | ICD-10-CM

## 2022-04-15 DIAGNOSIS — E119 Type 2 diabetes mellitus without complications: Secondary | ICD-10-CM

## 2022-04-15 DIAGNOSIS — I2510 Atherosclerotic heart disease of native coronary artery without angina pectoris: Secondary | ICD-10-CM

## 2022-04-15 DIAGNOSIS — Z950 Presence of cardiac pacemaker: Secondary | ICD-10-CM

## 2022-04-15 DIAGNOSIS — I495 Sick sinus syndrome: Secondary | ICD-10-CM

## 2022-04-15 DIAGNOSIS — K219 Gastro-esophageal reflux disease without esophagitis: Secondary | ICD-10-CM

## 2022-04-15 DIAGNOSIS — E7800 Pure hypercholesterolemia, unspecified: Secondary | ICD-10-CM

## 2022-04-15 DIAGNOSIS — I4510 Unspecified right bundle-branch block: Secondary | ICD-10-CM

## 2022-04-15 DIAGNOSIS — Z7901 Long term (current) use of anticoagulants: Secondary | ICD-10-CM

## 2022-04-15 DIAGNOSIS — E089 Diabetes mellitus due to underlying condition without complications: Secondary | ICD-10-CM

## 2022-05-24 ENCOUNTER — Encounter: Admit: 2022-05-24 | Discharge: 2022-05-24 | Payer: MEDICARE

## 2022-08-28 ENCOUNTER — Encounter: Admit: 2022-08-28 | Discharge: 2022-08-28 | Payer: MEDICARE

## 2022-09-08 ENCOUNTER — Encounter: Admit: 2022-09-08 | Discharge: 2022-09-08 | Payer: MEDICARE

## 2022-09-08 DIAGNOSIS — R079 Chest pain, unspecified: Secondary | ICD-10-CM

## 2022-09-08 DIAGNOSIS — I5020 Unspecified systolic (congestive) heart failure: Secondary | ICD-10-CM

## 2022-09-08 DIAGNOSIS — I214 Non-ST elevation (NSTEMI) myocardial infarction: Secondary | ICD-10-CM

## 2022-09-08 DIAGNOSIS — I255 Ischemic cardiomyopathy: Secondary | ICD-10-CM

## 2022-09-08 MED ORDER — MELATONIN 5 MG PO TAB
5 mg | Freq: Every evening | ORAL | 0 refills | Status: DC
Start: 2022-09-08 — End: 2022-09-13
  Administered 2022-09-09 – 2022-09-13 (×7): 5 mg via ORAL

## 2022-09-08 MED ORDER — TRAZODONE 50 MG PO TAB
50 mg | Freq: Every evening | ORAL | 0 refills | Status: DC | PRN
Start: 2022-09-08 — End: 2022-09-13
  Administered 2022-09-10 – 2022-09-13 (×4): 50 mg via ORAL

## 2022-09-08 MED ORDER — FINASTERIDE 5 MG PO TAB
5 mg | Freq: Every day | ORAL | 0 refills | Status: DC
Start: 2022-09-08 — End: 2022-09-13
  Administered 2022-09-09 – 2022-09-13 (×5): 5 mg via ORAL

## 2022-09-08 MED ORDER — PANTOPRAZOLE 40 MG PO TBEC
40 mg | Freq: Every day | ORAL | 0 refills | Status: DC
Start: 2022-09-08 — End: 2022-09-13
  Administered 2022-09-10 – 2022-09-13 (×4): 40 mg via ORAL

## 2022-09-08 MED ORDER — MELATONIN 5 MG PO TAB
5 mg | Freq: Every evening | ORAL | 0 refills | Status: DC
Start: 2022-09-08 — End: 2022-09-09

## 2022-09-08 MED ORDER — PRAVASTATIN 40 MG PO TAB
40 mg | Freq: Every evening | ORAL | 0 refills | Status: DC
Start: 2022-09-08 — End: 2022-09-09

## 2022-09-08 MED ORDER — METOPROLOL TARTRATE 25 MG PO TAB
25 mg | Freq: Two times a day (BID) | ORAL | 0 refills | Status: DC
Start: 2022-09-08 — End: 2022-09-11
  Administered 2022-09-09 – 2022-09-11 (×3): 25 mg via ORAL

## 2022-09-08 MED ORDER — HEPARIN (PORCINE) BOLUS FOR CONTINUOUS INFUSION (BAG) - APTT MAIN
20-40 [IU]/kg | INTRAVENOUS | 0 refills | Status: DC | PRN
Start: 2022-09-08 — End: 2022-09-11

## 2022-09-08 MED ORDER — TAMSULOSIN 0.4 MG PO CAP
.4 mg | Freq: Every day | ORAL | 0 refills | Status: DC
Start: 2022-09-08 — End: 2022-09-13
  Administered 2022-09-09 – 2022-09-13 (×5): 0.4 mg via ORAL

## 2022-09-08 MED ORDER — ASPIRIN 325 MG PO TAB
325 mg | Freq: Once | ORAL | 0 refills | Status: CP
Start: 2022-09-08 — End: ?
  Administered 2022-09-09: 14:00:00 325 mg via ORAL

## 2022-09-08 MED ORDER — SODIUM CHLORIDE 0.9 % IV SOLP
500 mL | INTRAVENOUS | 0 refills | Status: CP
Start: 2022-09-08 — End: ?
  Administered 2022-09-09: 14:00:00 500 mL via INTRAVENOUS

## 2022-09-08 MED ORDER — HEPARIN (PORCINE) IN 5 % DEX 20,000 UNIT/500 ML (40 UNIT/ML) IV SOLP
0-2000 [IU]/h | INTRAVENOUS | 0 refills | Status: DC
Start: 2022-09-08 — End: 2022-09-11
  Administered 2022-09-09 (×2): 1000 [IU]/h via INTRAVENOUS
  Administered 2022-09-10: 12:00:00 1100 [IU]/h via INTRAVENOUS

## 2022-09-08 MED ORDER — DICLOFENAC SODIUM 1 % TP GEL
4 g | Freq: Four times a day (QID) | TOPICAL | 0 refills | Status: DC | PRN
Start: 2022-09-08 — End: 2022-09-13
  Administered 2022-09-13: 14:00:00 4 g via TOPICAL

## 2022-09-09 ENCOUNTER — Inpatient Hospital Stay
Admit: 2022-09-09 | Discharge: 2022-09-13 | Disposition: A | Discharge status: Home / Self Care | Payer: Medicare Other | Source: Other Acute Inpatient Hospital | Attending: Cardiovascular Disease | Admitting: Cardiovascular Disease

## 2022-09-09 ENCOUNTER — Inpatient Hospital Stay: Admit: 2022-09-09 | Discharge: 2022-09-09 | Payer: MEDICARE

## 2022-09-09 DIAGNOSIS — R079 Chest pain, unspecified: Secondary | ICD-10-CM

## 2022-09-09 LAB — COMPREHENSIVE METABOLIC PANEL
ALBUMIN: 3.9 g/dL (ref 3.5–5.0)
ALK PHOSPHATASE: 63 U/L — ABNORMAL LOW (ref 25–110)
ALT: 9 U/L (ref 7–56)
ANION GAP: 11 10*3/uL (ref 3–12)
AST: 26 U/L (ref 7–40)
BLD UREA NITROGEN: 21 mg/dL (ref 7–25)
CALCIUM: 10 mg/dL — ABNORMAL HIGH (ref 8.5–10.6)
CHLORIDE: 106 MMOL/L (ref 98–110)
CO2: 22 MMOL/L (ref 21–30)
CREATININE: 1.1 mg/dL (ref 0.4–1.24)
EGFR: >60 mL/min (ref >60)
SODIUM: 139 MMOL/L (ref 137–147)
TOTAL BILIRUBIN: 0.6 mg/dL (ref 0.2–1.3)
TOTAL PROTEIN: 6.6 g/dL (ref 6.0–8.0)

## 2022-09-09 LAB — CBC AND DIFF
ABSOLUTE BASO COUNT: 0 10*3/uL (ref 0–0.20)
ABSOLUTE EOS COUNT: 0 10*3/uL (ref 0–0.45)
ABSOLUTE MONO COUNT: 0.5 10*3/uL (ref 0–0.80)
WBC COUNT: 8.1 10*3/uL (ref 4.5–11.0)

## 2022-09-09 LAB — HIGH SENSITIVITY TROPONIN I 0 HOUR: HIGH SENSITIVITY TROPONIN I 0 HOUR: 659 ng/L — ABNORMAL HIGH (ref <20)

## 2022-09-09 LAB — PTT (APTT): PTT: 33 s (ref 24.0–36.5)

## 2022-09-09 LAB — MAGNESIUM: MAGNESIUM: 2.2 mg/dL — ABNORMAL HIGH (ref 1.6–2.6)

## 2022-09-09 MED ORDER — INSULIN ASPART 100 UNIT/ML SC FLEXPEN
0-6 [IU] | Freq: Before meals | SUBCUTANEOUS | 0 refills | Status: DC
Start: 2022-09-09 — End: 2022-09-13
  Administered 2022-09-09: 17:00:00 1 [IU] via SUBCUTANEOUS

## 2022-09-09 MED ORDER — ASPIRIN 81 MG PO CHEW
81 mg | Freq: Every day | ORAL | 0 refills | Status: DC
Start: 2022-09-09 — End: 2022-09-13
  Administered 2022-09-11 – 2022-09-13 (×3): 81 mg via ORAL

## 2022-09-09 MED ORDER — PERFLUTREN LIPID MICROSPHERES 1.1 MG/ML IV SUSP
1-10 mL | Freq: Once | INTRAVENOUS | 0 refills | Status: CP | PRN
Start: 2022-09-09 — End: ?
  Administered 2022-09-09: 21:00:00 3 mL via INTRAVENOUS

## 2022-09-09 MED ORDER — SODIUM CHLORIDE 0.9 % IJ SOLN
10 mL | Freq: Once | INTRAVENOUS | 0 refills | Status: CP
Start: 2022-09-09 — End: ?
  Administered 2022-09-09: 21:00:00 10 mL via INTRAVENOUS

## 2022-09-09 MED ORDER — ROSUVASTATIN 20 MG PO TAB
40 mg | Freq: Every evening | ORAL | 0 refills | Status: DC
Start: 2022-09-09 — End: 2022-09-10
  Administered 2022-09-10: 01:00:00 40 mg via ORAL

## 2022-09-09 MED ORDER — MAGNESIUM SULFATE IN D5W 1 GRAM/100 ML IV PGBK
1 g | INTRAVENOUS | 0 refills | Status: CP
Start: 2022-09-09 — End: ?
  Administered 2022-09-09: 14:00:00 1 g via INTRAVENOUS

## 2022-09-09 MED ORDER — DEXTROSE 50 % IN WATER (D50W) IV SYRG
12.5-25 g | INTRAVENOUS | 0 refills | Status: DC | PRN
Start: 2022-09-09 — End: 2022-09-13

## 2022-09-09 MED ORDER — ASPIRIN 325 MG PO TAB
325 mg | Freq: Once | ORAL | 0 refills | Status: CP
Start: 2022-09-09 — End: ?
  Administered 2022-09-10: 14:00:00 325 mg via ORAL

## 2022-09-09 MED ORDER — IMS MIXTURE TEMPLATE
30 meq | Freq: Once | ORAL | 0 refills | Status: CP
Start: 2022-09-09 — End: ?
  Administered 2022-09-09 (×2): 30 meq via ORAL

## 2022-09-09 NOTE — Progress Notes
HC5 END OF SHIFT/PLAN OF CARE NURSING NOTE   Nursing Shift: Night Shift 1900-0700    Acute events, nursing interventions, & communication with providers: Pt had 4 beats of VTACH at 0416. Pt asymptomatic and VSS. Notified Forde Radon MD. Troponin peaked at 814 and heparin gtt started at 2230.      Patient Goal(s)  Patient will Verbalize readiness for discharge by the end of next shift.        Patient will  Verbalize readiness for discharge by discharge.   Admission Weight: Weight: 87.3 kg (192 lb 6.4 oz)    Last 3 Weights:   Vitals:    09/08/22 2105 09/09/22 0440   Weight: 87.3 kg (192 lb 6.4 oz) 86 kg (189 lb 9.6 oz)     Weight Change: Weight trend decreasing    Intake/Output Summary (Last 24 hours) at 09/09/2022 0448  Last data filed at 09/09/2022 0420  Gross per 24 hour   Intake 200 ml   Output 300 ml   Net -100 ml     Last Bowel Movement Date:  (pta)    Fluid Restriction? No   Quality/Safety    Total Fall Risk Score: 9   Fall Risk Level: Moderate (6-13)  Fall Risk Category: Medication and Patient Care Equipment  Risk for Injury related to falls: Bones susceptible to fracture and Coagulopathies/risk for bleed  Fall Prevention Interventions: Clutter- free environment, Urinal within reach,  Educate or display instructions for vision or hearing impairments, and Fall Risk Identifiers - Yellow wristband, yellow socks, sign outside door    Other safety precautions in place: N/A    Restraints:  No      Patient Education  This RN provided education to Patient today. The following education topics were reviewed:  Quality/Safety Education:   Fall risk and VTE prophylaxis  Medication Education:   Medication management (Indication, adverse effects, monitoring, etc)  Education provided on the following medication(s): heparin gtt  Cardiac - Specific Education:   Cardiac diagnosis specific education: NSTEMI  General Education:   Condition specific education: NSTEMI    The following teaching method(s) were used: Verbal  Response to learning: [employer] Understanding  Needs reinforcement on: vtach

## 2022-09-09 NOTE — Progress Notes
CARDIOPULMONARY REHABILITATION  INPATIENT ASSESSMENT    Cardiac Rehabilitation Staff: Ambrose Mantle Discharge Date:     Demographics  Pre-admit Dx: Coronary Artery Disease Date of Admission: 09/08/2022     Room: HC520/01 DOB:  [birth year]   Insurance: Primary: [employer] Medicare PPO Secondary: None   Address: [address]  [place] [address]   Patient Phone:  [phone] (home)        ED Contact:     Scrogham,Karen (Spouse)  [phone] (Mobile)     ED Phone #: [phone]   CTS:  N/a Cardiologist: Nickolas Madrid     Cardiac Procedures and Events         PCI: 09/09/22        EF: 60 %          Risk Factors     BP: 122/81  Height: 177.8 cm (5' 10)  Weight: 86 kg (189 lb 9.6 oz)  BMI (Calculated): 27.61      Medical History   has a past medical history of Diabetes mellitus (HCC) (06/26/2013), Dyspnea (02/06/2018), GERD (gastroesophageal reflux disease) (06/26/2013), Hyperlipemia (06/26/2013), and Hypertension (06/26/2013).    Labs  Cholesterol   Date Value Ref Range Status   12/17/2021 116  Final     Triglycerides   Date Value Ref Range Status   12/17/2021 92  Final     HDL   Date Value Ref Range Status   12/17/2021 29 (L) >40 Final     LDL   Date Value Ref Range Status   12/17/2021 69  Final     Hemoglobin A1C   Date Value Ref Range Status   12/17/2021 6.6 (H) 4.0 - 6.0 Final     Troponin-I   Date Value Ref Range Status   12/04/2002 6.76 (H) 0.0 - 0.05 NG/ML Final         Heart Resource Manual Given: 09/09/22     Teaching Completed: 09/09/22     Outpatient Cardiopulmonary Rehabilitation    Outpatient Cardic Rehab: Yes    [facility]    [phone]   Date Faxed: 09/09/22    [facility]    [phone]    If [place], Sent to Staff:            Ambrose Mantle, RN  09/09/2022

## 2022-09-09 NOTE — H&P (View-Only)
Cardiology History and Physical Examination      Name: Mark Robbins        Birthday: 1940-09-30                                MRN: [id number]    Admission Date: 09/08/2022                                                LOS: 1 day      Brief Hospital Course     Graylin is a 82 y.o. 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 He was transferred to [facility] for NSTEMI and evaluation in cath lab.      Assessment & Plan     Principal Problem:    NSTEMI (non-ST elevated myocardial infarction) (HCC)  Active Problems:    CAD (coronary artery disease)    Hypertension    GERD (gastroesophageal reflux disease)    Dyslipidemia    Diabetes mellitus (HCC)    Hx of CABG    Atrial fibrillation with slow ventricular response (HCC)    On apixaban therapy    Symptomatic bradycardia    Tachy-brady syndrome (HCC)    Pacemaker    Moderate mitral regurgitation        NEURO/PSYCH  #No acute concerns  Plan:  > Monitor for signs/symptoms related to neuro/psych dysfunction      PULMONARY  #No acute concerns  - Patient initially with SOA with his chest pain that have both since resolved. Maintaining saturations on room air.  Plan:     > Monitor for signs/symptoms related to pulmonary dysfunction      CARDIOVASCULAR  #NSTEMI, type 1  #CAD s/p CABG 2004 (SVG to PDA, reverse SVG to RI, OM1, OM2, and OM3)  #Permanent atrial fibrillation on chronic anticoagulation with apixaban  #Tachy/Brady syndrome with symptomatic bradycardia s/p PPM  #Mitral regurgitation  #HTN  #Dyslipidemia  - Patient presented to outside hospital with chest pain and SOA. Reports that pain felt more like a pulled muscle (previous MI felt like an elephant on my chest.). Both resolved in transit to [place] from OSH  - EKG at OSH without ST elevations or depressions. Troponin 3.085 (not high-sensitivity)  - Admission EKG: Rate-controlled atrial fibrillation with intermittent v-pacing  - 04/2022 Echo: EF 60%; unable to assess diastolic dysfunction, mild to moderate MR, mild TR, aortic valve sclerosis with calcification and mild MI  - 12/2021 Lipid Profile: cholesterol 116, trigs 92, HDL 29, and LDL 69  - Trop: No results for input(s): TNI in the last 72 hours.  - PTA meds: apixaban 5mg  BID, metoprolol tartrate 25mg  BID, losartan 100mg  daily, pravastatin 40mg  qHS  Plan:  > Patient was started on heparin drip at OSH. This was continued on admission to [place]. Holding PTA apixaban.  > Left heart catheterization ordered. NPO @ [facility].   > Trending troponin to peak  > Daily AM BMP and Mg with plans to  keep K>/=4 and Mg>/=2.  > Will continue PTA metoprolol tartrate 25mg  BID and pravastatin 40mg  qHS.  > Holding PTA losartan with upcoming cath and elevated creatinine at OSH. Can consider restarting while inpatient should creatinine remain stable.        FEN/GI  #GERD  - Symptoms well managed on current regimen.  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 Consideration of CKD2 or 3a should be made based on these findings.   Recent Labs     09/08/22  2210   CR 1.14   BUN 21     - Net IO Since Admission: 50 mL [09/09/22 0005]  -   Intake/Output Summary (Last 24 hours) at 09/09/2022 0005  Last data filed at 09/08/2022 2225  Gross per 24 hour   Intake 200 ml Output 150 ml   Net 50 ml   - PTA Medications: tamsulosin 0.4mg  daily and finasteride 5mg  daily  Plan:  > replace electrolytes PRN  > Monitor kidney function with daily BMP  > Continue PTA tamsulosin and finasteride      ENDOCRINE  #DMT2  - 12/2021 A1c 6.6%  - No results for input(s): GLUPOC in the last 72 hours.  - PTA Medications: Jardiance 25mg  daily  Plan:  > Holding PTA Jardiance   > POC ACHS blood sugar checks with LDCF ordered      HEME/ONC  #No acute concerns  Recent Labs     09/08/22  2210   HGB 15.2   MCV 85.8   PLTCT 173   Plan:  > Monitor HGB with daily CBC  > Transfuse if Hgb <7 or <8 with active bleeding  > DVT Ppx: heparin drip      ID  #No acute concerns  Recent Labs     09/08/22  2210   WBC 8.1     - Temp (24hrs), Avg:36.7 ?C (98.1 ?F), Min:36.7 ?C (98.1 ?F), Max:36.7 ?C (98.1 ?F)  Plan:  > Continue to monitor for signs/symptoms of infection      MSK/DERM  Wounds:  Active Wounds                                 LDA/PROPHYLAXIS  Lines: PIV (x2)  Tubes: None  Urinary Catheter:  No  Antibiotic Usage:  No  VTE ppx: SCDs, heparin gtt  GI:  PPI  Bowel regimen: Pt having regular BMs  Diet: DIET NPO AT MIDNIGHT Sips With Medications  Code status: FULL CODE      Patient discussed with attending physician, Dr. Geronimo Boot.    Forde Radon, MD  Internal Medicine, PGY2        _________________________________________________________________________  Subjective     History of Present Illness: Mark Robbins is a 82 y.o. 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 He was transferred to [facility] for NSTEMI and evaluation in cath lab.    His symptoms began earlier in the week and seemed to come out of the blue. He does not remember anginal symptoms prior to one week ago. He says his SOA that had been worsening throughout the week was the most notable symptom. He did have some intermittent chest discomfort as well, but he didn't think much of it because it felt so different that his previous MI. With his previous MI, he reports his chest pain as being an intense pressure and describes it as an elephant sitting on his chest. He describes this episode of chest pain as being more like a pulled muscle from his shoulder to his sternum. Both the chest pain and SOA resolved en route to [place].     ROS:   Review of Systems   Constitutional:  Negative for chills, fever and malaise/fatigue.   HENT:  Negative for congestion.    Eyes:  Negative for blurred vision, double vision and photophobia.   Respiratory:  Negative for shortness of breath.    Cardiovascular:  Negative for chest pain, palpitations, orthopnea, leg swelling and PND.   Gastrointestinal:  Negative for abdominal pain, constipation, heartburn, nausea and vomiting.   Genitourinary:  Negative for dysuria.   Musculoskeletal:  Negative for back pain, joint pain and myalgias.   Skin:  Negative for rash.   Neurological:  Negative for dizziness, weakness and headaches.   Psychiatric/Behavioral:  Negative for depression.         The patient  has a past medical history of Diabetes mellitus (HCC) (06/26/2013), Dyspnea (02/06/2018), GERD (gastroesophageal reflux disease) (06/26/2013), Hyperlipemia (06/26/2013), and Hypertension (06/26/2013).    The patient  has no past surgical history on file.    Social History     Tobacco Use    Smoking status: Former    Smokeless tobacco: Never   Vaping Use    Vaping status: Never Used   Substance Use Topics    Alcohol use: No    Drug use: No     family history is not on file.    Allergies:  Shellfish containing products, Duloxetine, Levothyroxine, and Lisinopril      Objective     Scheduled Meds:aspirin tablet 325 mg, 325 mg, Oral, ONCE  finasteride (PROSCAR) tablet 5 mg, 5 mg, Oral, QDAY  melatonin tablet 5 mg, 5 mg, Oral, QHS  metoprolol tartrate tablet 25 mg, 25 mg, Oral, BID  pantoprazole DR (PROTONIX) tablet 40 mg, 40 mg, Oral, QDAY(21)  pravastatin (PRAVACHOL) tablet 40 mg, 40 mg, Oral, QHS  sodium chloride 0.9 %   infusion, 500 mL, Intravenous, Bolus  tamsulosin (FLOMAX) capsule 0.4 mg, 0.4 mg, Oral, QDAY after breakfast    Continuous Infusions:   heparin (porcine) 20,000 units/D5W 500 mL infusion (std conc)(premade) 1,000 Units/hr (09/08/22 2225)     PRN and Respiratory Meds:diclofenac sodium QID PRN, heparin (porcine) TITRATE **AND** heparin (porcine) Q6H PRN, traZODone QHS PRN                           Vital Signs: Last Filed  Vital Signs: 24 Hour Range   BP: 107/93 (08/07 2105)  Temp: 36.7 ?C (98.1 ?F) (08/07 2105)  Pulse: 73 (08/07 2105)  Respirations: 18 PER MINUTE (08/07 2105)  SpO2: 96 % (08/07 2105)  O2 Device: None (Room air) (08/07 2105)  Height: 177.8 cm (5' 10) (08/07 2105) BP: (107)/(93)   Temp:  [36.7 ?C (98.1 ?F)]   Pulse:  [73]   Respirations:  [18 PER MINUTE]   SpO2:  [96 %]   O2 Device: None (Room air)     Vitals:    09/08/22 2105   Weight: 87.3 kg (192 lb 6.4 oz)         Physical Exam:  Constitutional: 82 y.o. male AAOx3, resting in bed in no apparent distress  Head: Normocephalic, atraumatic  Eyes: Extra-occular muscles intact, PERRL, clear sclera  ENT: Nose midline without drainage, neck supple without tracheal deviation, oral mucous membrane moist  Cardiovascular: Regular rhythm, normal rate, normal S1 and S2, soft murmur mid-axillary at 5th intercostal space  Pulmonary: Clear to auscultation bilaterally, no wheezes or rales  GI: Abdomen soft, non-tender, non-distended (baseline habitus), bowel sounds throughout  Skin: No rashes or bruises, good turgor, cap refill <2s  Neuro: Cranial nerves II-XII intact, no gross focal neurological deficits  Musculoskeletal: Moves all extremities well  Lymphatic/extremities: No pitting edema, pulses present b/l    Artificial Airway   None       Ventilator/Respiratory Therapy  No     Vent Weaning   Not applicable    Laboratory:  Recent Labs     09/08/22  2210   NA 139   K 5.4*   CL 106   CO2 22   GAP 11   BUN 21   CR 1.14   GLU 117*   CA 10.0   ALBUMIN 3.9   MG 2.2       Recent Labs     09/08/22  2210   WBC 8.1   HGB 15.2   HCT 44.9   PLTCT 173   PTT 33.9   AST 26   ALT 9   ALKPHOS 63      Estimated Creatinine Clearance: 63.8 mL/min (based on SCr of 1.14 mg/dL).  Vitals:    09/08/22 2105   Weight: 87.3 kg (192 lb 6.4 oz)    No results for input(s): PHART, PO2ART in the last 72 hours.    Invalid input(s): PC02A      Pertinent radiology reviewed.

## 2022-09-09 NOTE — Progress Notes
09/09/22 0800   Current Cardiac Procedures/Events   Pre Admit Dx Coronary Artery Disease   PCI 09/09/22   EF 60 %   Education   Person Instructed Patient;Spouse   Patient Barriers To Learning None Noted   Interventions/Teaching Methods Verbal Instructions;Written Materials Provided   Patient Response Verbalized Understanding   Topics Outpatient Cardiac Rehab Referral Information;Home Walking Guidelines;Risk Factor Management - Physical Inactivity;Risk Factor Management - Stress;Risk Factor Management - Cholesterol;Risk Factor Management - Blood Pressure;Reasons to call your doctor   Teaching Completed 09/09/22   Heart Resource Manual Given 09/09/22   Outpatient Cardiopulmonary Rehab   OPCR Yes   [facility]    [phone]   Date Faxed 09/09/22

## 2022-09-09 NOTE — Progress Notes
Cardiology History and Physical Examination      Name: CORNEILUS HEGGIE        Birthday: 1940-10-14                                MRN: [id number]    Admission Date: 09/08/2022                                                LOS: 1 day      Brief Hospital Course     Rehman is a 82 y.o. 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 He was transferred to [facility] for NSTEMI and evaluation in cath lab.    Assessment & Plan     Principal Problem:    NSTEMI (non-ST elevated myocardial infarction) (HCC)  Active Problems:    CAD (coronary artery disease)    Hypertension    GERD (gastroesophageal reflux disease)    Mixed dyslipidemia    Diabetes mellitus (HCC)    Hx of CABG    Permanent atrial fibrillation (HCC)    On apixaban therapy    Symptomatic bradycardia    Tachy-brady syndrome (HCC)    Pacemaker    Moderate mitral regurgitation      Interval Update/Plan:  > Patient was started on heparin drip at OSH. This was continued on admission to [facility]. Holding PTA apixaban.  > Left heart catheterization ordered, plan is for tomorrow. NPO @ MN.  > Trending troponin, peaked at 814 (slight rise @2hr  but mostly flat since admission).  > Daily AM BMP and Mg with plans to keep K>/=4 and Mg>/=2. (Repleted K+ today with )  > Will continue PTA metoprolol tartrate 25mg  BID.  > Most recent lipid panel 12/2021: cholesterol 116, trigs 92, HDL 29, and LDL 69. Repeat lipid panel ordered. Plan to switch to rosuvastatin 40mg   > Holding PTA losartan with upcoming cath and elevated creatinine at OSH.   - Cr stable at ~1.1, BP levels WNL around 100-120s systolic since admission. Will continue to hold Losartan for now  > Echo ordered to evaluate for signs of wall motion abnormalities and/or elevated pressures      NEURO/PSYCH  #No acute concerns  Plan:  > Monitor for signs/symptoms related to neuro/psych dysfunction      PULMONARY  #No acute concerns  - Patient initially with SOB with his chest pain that have both since resolved. Maintaining saturations on room air.  - No home O2 requirements  - SOB with minimal exertion  Plan:   > Monitor for signs/symptoms related to pulmonary dysfunction  > O2 supplementation PRN to maintain >92%      CARDIOVASCULAR  #NSTEMI, type 1  #CAD s/p CABG 2004 (SVG to PDA, reverse SVG to RI, OM1, OM2, and OM3)  #Permanent atrial fibrillation on chronic anticoagulation with apixaban  #Tachy/Brady syndrome with symptomatic bradycardia s/p PPM  #Mitral regurgitation  #HTN  #Mixed Dyslipidemia  - Patient presented to outside hospital with  chest pain and SOA. Reports that pain felt more like a pulled muscle (previous MI felt like an elephant on my chest.). Both resolved in transit to [facility] from OSH  - EKG at OSH without ST elevations or depressions. Troponin 3.085 (not high-sensitivity)  - Admission EKG: Rate-controlled atrial fibrillation with intermittent v-pacing  - 04/2022 Echo: EF 60%; unable to assess diastolic dysfunction, mild to moderate MR, mild TR, aortic valve sclerosis with calcification and mild MI  - 12/2021 Lipid Profile: cholesterol 116, trigs 92, HDL 29, and LDL 69  - Trop: [facility] admission -> 659/814/765 for 0-/2-/4-hour; BNP was 4087.  - PTA meds: apixaban 5mg  BID, metoprolol tartrate 25mg  BID, losartan 100mg  daily, pravastatin 40mg  qHS  Plan:  > Patient was started on heparin drip at OSH. This was continued on admission to [facility]. Holding PTA apixaban.  > Left heart catheterization ordered, plan is for tomorrow. NPO @ MN.  > Trending troponin, peaked at 814 (slight rise @2hr  but mostly flat since admission).  > Daily AM BMP and Mg with plans to keep K>/=4 and Mg>/=2. (Repleted K+ today with )  > Will continue PTA metoprolol tartrate 25mg  BID.  > Most recent lipid panel 12/2021: cholesterol 116, trigs 92, HDL 29, and LDL 69. Repeat lipid panel ordered. Plan to switch to rosuvastatin 40mg   > Holding PTA losartan with upcoming cath and elevated creatinine at OSH. Cr stable at ~1.1, BP levels WNL around 100-120s systolic since admission. Will continue to hold Losartan for now  > Echo ordered to evaluate for signs of wall motion abnormalities and/or elevated pressures      FEN/GI  #GERD  - Symptoms well managed on current regimen.  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 Consideration of CKD2 or 3a should be made based on these findings.   Recent Labs     09/08/22  2210 09/09/22  0358   CR 1.14 1.17   BUN 21 18     Intake/Output Summary (Last 24 hours) at 09/09/2022 1509  Last data filed at 09/09/2022 1216  Gross per 24 hour   Intake 753.56 ml   Output 525 ml   Net 228.56 ml   - PTA Medications: tamsulosin 0.4mg  daily and finasteride 5mg  daily  Plan:  > Replace electrolytes PRN  > Monitor kidney function with daily BMP  > Continue PTA tamsulosin and finasteride      ENDOCRINE  #DMT2  - 12/2021 A1c 6.6%  - PTA Medications: Jardiance 25mg  daily  - No prior insulin requirements PTA  Plan:  > Holding PTA Jardiance   > POC ACHS blood sugar checks with LDCF ordered      HEME/ONC  #No acute concerns  Recent Labs     09/08/22  2210 09/09/22  0358   HGB 15.2 14.4   MCV 85.8 86.3   PLTCT 173 205   Plan:  > Monitor Hgb with daily CBC  > Transfuse if Hgb <7 or <8 with active bleeding  > DVT Ppx: heparin drip; holding PTA apixaban      ID  #  No acute concerns  Recent Labs     09/08/22  2210 09/09/22  0358   WBC 8.1 7.2     - Temp (24hrs), Avg:36.4 ?C (97.6 ?F), Min:36.3 ?C (97.3 ?F), Max:36.7 ?C (98.1 ?F)  Plan:  > Continue to monitor for signs/symptoms of infection      MSK/DERM  No concerns at this time      LDA/PROPHYLAXIS  Lines: PIV (x2)  Tubes: None  Urinary Catheter:  No  Antibiotic Usage:  No  VTE ppx: SCDs, heparin gtt  GI:  PPI  Bowel regimen: Pt having regular BMs  Diet: DIET CARDIAC(LOW FAT/LOW SODIUM)  DIET NPO AT MIDNIGHT Sips With Medications  Code status: FULL CODE      Patient discussed with attending physician, Dr. Beather Arbour.    Jonelle Sidle  Medical Student, M4    ATTESTATION    I personally performed or re-performed the history, physical exam and treatment plan for the E/M. I discussed the case with the Medical Student, and concur with the Medical Student documentation of history, physical exam and treatment plan unless otherwise noted.    Resident name:  Octaviano Glow, MD Date:  09/09/2022      _________________________________________________________________________  Subjective     His symptoms began earlier in the week and seemed to come out of the blue. He does not remember anginal symptoms prior to one week ago. He says his SOB, chest discomfort are associated with minimal exertion and relieved at rest. His symptoms have been worsening throughout the last 1-2 weeks, SOB is the most concerning symptoms. Patient states his chest does not feel like his first MI back in 2004. With his previous MI, he reports his chest pain as being an intense pressure and describes it as an elephant sitting on his chest. He describes this episode of chest pain as being more like a pulled muscle or tightness over his sternum. Both the chest pain and SOB resolved en route to [facility] and have stay resolved since admission but has not been up and out of bed.      ROS:   Review of Systems   Constitutional:  Negative for chills, diaphoresis, fever and malaise/fatigue. HENT:  Negative for congestion.    Eyes:  Negative for blurred vision, double vision and photophobia.   Respiratory:  Negative for cough and shortness of breath.         Shortness of breath   Cardiovascular:  Negative for chest pain, palpitations, orthopnea, leg swelling and PND.   Gastrointestinal:  Negative for abdominal pain, constipation, heartburn, nausea and vomiting.   Genitourinary:  Negative for dysuria.   Musculoskeletal:  Negative for back pain, joint pain and myalgias.   Skin:  Negative for rash.   Neurological:  Negative for dizziness, weakness and headaches.   Psychiatric/Behavioral:  Negative for depression.         The patient  has a past medical history of Diabetes mellitus (HCC) (06/26/2013), Dyspnea (02/06/2018), GERD (gastroesophageal reflux disease) (06/26/2013), Hyperlipemia (06/26/2013), and Hypertension (06/26/2013).    The patient  has no past surgical history on file.    Social History     Tobacco Use    Smoking status: Former    Smokeless tobacco: Never   Vaping Use    Vaping status: Never Used   Substance Use Topics    Alcohol use: No    Drug use: No     family history is not on file.    Allergies:  Shellfish  containing products, Duloxetine, Levothyroxine, and Lisinopril      Objective     Scheduled Meds:[START ON 09/11/2022] aspirin chewable tablet 81 mg, 81 mg, Oral, QDAY  [START ON 09/10/2022] aspirin tablet 325 mg, 325 mg, Oral, ONCE  finasteride (PROSCAR) tablet 5 mg, 5 mg, Oral, QDAY  insulin aspart (U-100) (NOVOLOG FLEXPEN U-100 INSULIN) injection PEN 0-6 Units, 0-6 Units, Subcutaneous, ACHS (22)  melatonin tablet 5 mg, 5 mg, Oral, QHS  metoprolol tartrate tablet 25 mg, 25 mg, Oral, BID  pantoprazole DR (PROTONIX) tablet 40 mg, 40 mg, Oral, QDAY(21)  rosuvastatin (CRESTOR) tablet 40 mg, 40 mg, Oral, QHS  sodium chloride PF 0.9% injection 10 mL, 10 mL, Intravenous, ONCE  tamsulosin (FLOMAX) capsule 0.4 mg, 0.4 mg, Oral, QDAY after breakfast    Continuous Infusions:   heparin (porcine) 20,000 units/D5W 500 mL infusion (std conc)(premade) 1,000 Units/hr (09/09/22 1146)     PRN and Respiratory Meds:dextrose 50% (D50) IV PRN, diclofenac sodium QID PRN, heparin (porcine) TITRATE **AND** heparin (porcine) Q6H PRN, perflutren lipid microspheres Once PRN **AND** sodium chloride PF 0.9% ONCE, traZODone QHS PRN                           Vital Signs: Last Filed                 Vital Signs: 24 Hour Range   BP: 100/63 (08/08 1237)  Temp: 36.3 ?C (97.4 ?F) (08/08 1237)  Pulse: 55 (08/08 1251)  Respirations: 18 PER MINUTE (08/08 1237)  SpO2: 93 % (08/08 1237)  O2 Device: None (Room air) (08/08 1237)  Height: 177.8 cm (5' 10) (08/07 2105) BP: (100-122)/(63-93)   Temp:  [36.3 ?C (97.3 ?F)-36.7 ?C (98.1 ?F)]   Pulse:  [52-76]   Respirations:  [18 PER MINUTE]   SpO2:  [93 %-96 %]   O2 Device: None (Room air)     Vitals:    09/08/22 2105 09/09/22 0440   Weight: 87.3 kg (192 lb 6.4 oz) 86 kg (189 lb 9.6 oz)           Physical Exam  Constitutional:       Appearance: Normal appearance.   HENT:      Head: Normocephalic.      Mouth/Throat:      Mouth: Mucous membranes are moist.   Eyes:      Extraocular Movements: Extraocular movements intact.   Neck:      Vascular: No carotid bruit or JVD.   Cardiovascular:      Rate and Rhythm: Rhythm irregularly irregular.      Heart sounds: Murmur heard.      Comments: Grade 2/6 systolic murmur heard at mid-axillary line at the left 5th intercostal space  Pulmonary:      Effort: Pulmonary effort is normal. No respiratory distress.      Breath sounds: Normal breath sounds.   Abdominal:      General: There is no distension.      Palpations: Abdomen is soft.      Tenderness: There is no abdominal tenderness.   Musculoskeletal:         General: No swelling or tenderness.   Neurological:      General: No focal deficit present.      Mental Status: He is alert and oriented to person, place, and time.      Motor: No weakness.          Artificial Airway   None  Ventilator/Respiratory Therapy  No     Vent Weaning   Not applicable    Laboratory:  Recent Labs     09/08/22  2210 09/09/22  0358   NA 139 142   K 5.4* 3.7   CL 106 108   CO2 22 24   GAP 11 10   BUN 21 18   CR 1.14 1.17   GLU 117* 132*   CA 10.0 8.9   ALBUMIN 3.9  --    MG 2.2 1.9       Recent Labs     09/08/22  2210 09/09/22  0358 09/09/22  0430 09/09/22  1033   WBC 8.1 7.2  --   --    HGB 15.2 14.4  --   --    HCT 44.9 42.9  --   --    PLTCT 173 205  --   --    PTT 33.9  --  36.8* 51.6*   AST 26  --   --   --    ALT 9  --   --   --    ALKPHOS 63  --   --   --       Estimated Creatinine Clearance: 61.3 mL/min (based on SCr of 1.17 mg/dL).  Vitals:    09/08/22 2105 09/09/22 0440   Weight: 87.3 kg (192 lb 6.4 oz) 86 kg (189 lb 9.6 oz)    No results for input(s): PHART, PO2ART in the last 72 hours.    Invalid input(s): PC02A      Pertinent radiology reviewed.    Echo (04/05/22):  Normal left ventricular systolic function with an EF of 60%.  The LV cavity appears normal in size.  Assessment of wall motion was problematic due to poor endocardial visualization.  Though no definite areas of hypokinesis were appreciated.  Unable to assess diastolic function.  The RV was not optimally visualized, the size and function were probably normal.  Right-sided device leads were noted.  Mitral annular calcification with nonspecific mitral valve thickening without significant stenosis.  Mild to moderate MR.  Mild TR.  Aortic valve sclerosis with calcification, there is no significant stenosis.  Mild AI.  Peak PAP of 29 mmHg plus the right atrial pressure.  The IVC is not well-visualized to assess right atrial pressure.  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  No pericardial effusion.

## 2022-09-09 NOTE — Progress Notes
HC5 END OF SHIFT/PLAN OF CARE NURSING NOTE   Nursing Shift: Day Shift 0700-1900    Acute events, nursing interventions, & communication with providers: Pt got echo today. Pt remains on heparin gtt and is anticipating getting LHC tomorrow. Pt had family visiting at bedside throughout the day. Pt not endorsing any chest pain today.       Patient Goal(s)  Patient will Report pain is controlled by the end of next shift.        Patient will  Report progressive increase in activity tolerance by discharge.   Admission Weight: Weight: 87.3 kg (192 lb 6.4 oz)    Last 3 Weights:   Vitals:    09/08/22 2105 09/09/22 0440 09/09/22 1621   Weight: 87.3 kg (192 lb 6.4 oz) 86 kg (189 lb 9.6 oz) 85.7 kg (189 lb)     Weight Change: Weight trend decreasing    Intake/Output Summary (Last 24 hours) at 09/09/2022 1829  Last data filed at 09/09/2022 1758  Gross per 24 hour   Intake 1407.69 ml   Output 800 ml   Net 607.69 ml     Last Bowel Movement Date:  (prior)    Fluid Restriction? No   Quality/Safety    Total Fall Risk Score: 9   Fall Risk Level: Moderate (6-13)  Fall Risk Category: Medication, Patient Care Equipment, and Mobility  Risk for Injury related to falls: Coagulopathies/risk for bleed  Fall Prevention Interventions: Clutter- free environment, Urinal within reach,  Stay within arm's reach while ambulating, toileting, showering,  Wrap up extra cords/tubing to avoid tripping,  Mobility Device,  Increase frequency of intentional rounding, and Fall Risk Identifiers - Yellow wristband, yellow socks, sign outside door    Restraints:  No      Patient Education  This RN provided education to Patient, Family, and Significant Other today. The following education topics were reviewed:  Quality/Safety Education:   Fall risk  Medication Education:   Cardiac medication(s) and Medication management (Indication, adverse effects, monitoring, etc)  Education provided on the following medication(s): heparin gtt, insulin, magnesium, potassium, metoprolol  Cardiac - Specific Education:   Cardiac diet/nutrition  General Education:   Diet/nutrition, Glucose management, Mobility/Activity intolerance, and Tests/Procedures: Heparin gtt ptt lab draws, troponin, LHC prep, echo    The following teaching method(s) were used: Verbal and Demonstration  Response to learning: [employer]

## 2022-09-09 NOTE — Case Management (ED)
Case Management Admission Assessment    NAME:Mark Robbins                          MRN: [id number]             DOB:28-Jan-1941          AGE: 82 y.o.  ADMISSION DATE: 09/08/2022             DAYS ADMITTED: LOS: 1 day      Today?s Date: 09/09/2022    Source of Information: This CM met with pt's wife, Clydie Braun for assessment on this date.  Provided contact information and explanation of SW/NCM roles.  Reviewed [facility], Preparing for Discharge, and [facility].  Provided opportunity for questions and discussion. Pt/family encouraged to contact Case Management team with questions and concerns during hospitalization and until patient is able to transition back to the patient's primary care physician.  -Patient and his wife live in [place] home in [place].  Patient is independent.  He has a SPC and a rollator.  He has a caregiver who comes three times weekly through the [facility].         Plan  Plan: Assist PRN with SW/NCM Services    Patient Address/Phone  [address]  [place] [address]  [phone] (home)     Emergency Contact  Extended Emergency Contact Information  Primary Emergency Contact: [place], [place]  Mobile Phone: [phone]  Relation: Spouse    Healthcare Directive  Healthcare Directive: Yes, patient has a healthcare directive  Type of Healthcare Directive: Durable power of attorney for healthcare, Healthcare directive  Location of Healthcare Directive: Patient does not have it with him/her  Would patient like to fill out a (a new) Editor, commissioning?: No, patient declined  Psych Advance Directive (Psych unit only): No, patient does not have a Social research officer, government  Does the Patient Need Case Management to Arrange Discharge Transport? (ex: facility, ambulance, wheelchair/stretcher, Medicaid, cab, other): No  Will the Patient Use Family Transport?: Yes  Transportation Name, Phone and Availability #1: son    Expected Discharge Date  09/11/2022     Living Situation Prior to Admission  Living Arrangements  Type of Residence: Home, independent  Living Arrangements: Spouse/significant other  Financial risk analyst / Tub: Psychologist, counselling  How many levels in the residence?: 1  Can patient live on one level if needed?: Yes  Does residence have entry and/or inside stairs?: Yes (1)  Assistance needed prior to admit or anticipated on discharge: No  Level of Function   Prior level of function: Independent  Cognitive Abilities   Cognitive Abilities: Engages in problem solving and planning, Participates in Armed forces training and education officer  Primary Insurance: Medicare Replacement  Secondary Insurance: VA/[place]  VA Location: [place]  Additional Coverage: None  Medication Coverage    Have you experienced a noticeable increase in your copay costs recently?: No  Are current medications affordable?: Yes  Do You Use a Co-Pay Card or a Medication Assistance Program to Help Manage Medication Costs?: No  Do You Manage Your Own Medications?: Yes  Source of Income   Source Of Income: SSI  Financial Assistance Needed?  no    Psychosocial Needs  Mental Health  Mental Health History: No  Substance Use History  Substance Use History Screen: No  Other  no    Current/Previous Services  PCP  Erskine Emery, [phone], [phone]  Pharmacy    [facility] - [place], [place] - [address]  [address] [place] [address]  Phone: [phone] Fax: [phone]    [facility] - [place], [place] - [address]  [address]  [place] [address]  Phone: [phone] Fax: [phone]    Durable Medical Equipment   Durable Medical Equipment at home: Single Point Marissa, Rollator  Home Health  Receiving home health: No  Hemodialysis or Peritoneal Dialysis  Undergoing hemodialysis or peritoneal dialysis: No  Tube/Enteral Feeds  Receive tube/enteral feeds: No  Infusion  Receive infusions: No  Private Duty  Private duty help used: No  Home and Community Based Services  Home and community based services: No  Ryan White  Ryan White: N/A  Hospice  Hospice: No  Outpatient Therapy  PT: No  OT: No  SLP: No  Skilled Nursing Facility/Nursing Home  SNF: No  NH: No  Inpatient Rehab  IPR: No  Long-Term Acute Care Hospital  LTACH: No  Acute Hospital Stay  Acute Hospital Stay: In the past  Was patient's stay within the last 30 days?: No      Tristan Schroeder, CCM  Integrated Case Manager  *[phone]

## 2022-09-10 ENCOUNTER — Encounter: Admit: 2022-09-10 | Discharge: 2022-09-10 | Payer: MEDICARE

## 2022-09-10 MED ORDER — TRAZODONE 50 MG PO TAB
25 mg | Freq: Every evening | ORAL | 0 refills | Status: CN | PRN
Start: 2022-09-10 — End: ?

## 2022-09-10 MED ORDER — DIPHENHYDRAMINE HCL 50 MG/ML IJ SOLN
25 mg | INTRAVENOUS | 0 refills | Status: DC | PRN
Start: 2022-09-10 — End: 2022-09-13

## 2022-09-10 MED ORDER — LOSARTAN 25 MG PO TAB
12.5 mg | Freq: Every day | ORAL | 0 refills | Status: DC
Start: 2022-09-10 — End: 2022-09-13
  Administered 2022-09-10 – 2022-09-13 (×4): 12.5 mg via ORAL

## 2022-09-10 MED ORDER — POTASSIUM CHLORIDE 20 MEQ PO TBTQ
60 meq | Freq: Once | ORAL | 0 refills | Status: CP
Start: 2022-09-10 — End: ?
  Administered 2022-09-10: 14:00:00 60 meq via ORAL

## 2022-09-10 MED ORDER — ACETAMINOPHEN 325 MG PO TAB
650 mg | ORAL | 0 refills | Status: DC | PRN
Start: 2022-09-10 — End: 2022-09-13

## 2022-09-10 MED ORDER — CLOPIDOGREL 75 MG PO TAB
75 mg | Freq: Every day | ORAL | 0 refills | Status: DC
Start: 2022-09-10 — End: 2022-09-13
  Administered 2022-09-11 – 2022-09-13 (×3): 75 mg via ORAL

## 2022-09-10 MED ORDER — DIPHENHYDRAMINE HCL 25 MG PO CAP
25 mg | ORAL | 0 refills | Status: DC | PRN
Start: 2022-09-10 — End: 2022-09-13

## 2022-09-10 MED ORDER — FUROSEMIDE 40 MG PO TAB
40 mg | Freq: Two times a day (BID) | ORAL | 0 refills | Status: DC
Start: 2022-09-10 — End: 2022-09-13
  Administered 2022-09-10 – 2022-09-13 (×2): 40 mg via ORAL

## 2022-09-10 MED ORDER — LOSARTAN 25 MG PO TAB
12.5 mg | Freq: Every day | ORAL | 0 refills | Status: CN
Start: 2022-09-10 — End: ?

## 2022-09-10 MED ORDER — ALUMINUM-MAGNESIUM HYDROXIDE 200-200 MG/5 ML PO SUSP
30 mL | ORAL | 0 refills | Status: DC | PRN
Start: 2022-09-10 — End: 2022-09-13

## 2022-09-10 MED ORDER — ONDANSETRON HCL (PF) 4 MG/2 ML IJ SOLN
4 mg | INTRAVENOUS | 0 refills | Status: DC | PRN
Start: 2022-09-10 — End: 2022-09-13

## 2022-09-10 MED ORDER — ROSUVASTATIN 20 MG PO TAB
20 mg | Freq: Every evening | ORAL | 0 refills | Status: DC
Start: 2022-09-10 — End: 2022-09-13
  Administered 2022-09-11 – 2022-09-13 (×3): 20 mg via ORAL

## 2022-09-10 MED ADMIN — SODIUM CHLORIDE 0.9 % IV SOLP [27838]: 1000 mL | INTRAVENOUS | @ 20:00:00 | Stop: 2022-09-10 | NDC 00338004904

## 2022-09-10 NOTE — Progress Notes
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  The patient verbalized understanding of these conditions and consented to the procedure.    CONSCIOUS SEDATION ASSESSMENT    Appropriate history and physical on chart?    yes  Risks, benefits and options of conscious sedation discussed with patient.  Previous anesthesia experiences reviewed.  Informed consent obtained?    yes    Physical exam:    Heart:  normal rate, regular rhythm, normal S1, S2, no murmurs, rubs, clicks or gallops, normal S1, S2.  No murmurs, rubs or gallops.  Lungs:  clear to auscultation and clear to auscultation, unlabored breathing.  Airway:  normal      ASA Classification:    1.  A normal healthy patient  2.  A patient with mild systemic disease  3.  A patient with severe systemic disease.  Limits activity, but not incapacitating.  4.  A patient with incapacitating systemic disease that is a constant threat to life.  5.  A moribund patient, not expected to survive 24 hours with or without the procedure.     Choose ASA Class:   2    NPO status per policy:  yes    Current status unchanged since initial evaluation: yes  Time of last oral intake: NPO since midnight    Assessment: No abnormalities    Note: If any factors present an anesthesia consult should be considered    [place]: II    ASA Class: ASA II (A normal patient with mild systemic disease)    Physician has discussed risks and alternatives of this type sedation and above planned procedure(s) with: Patient    Allergies:   Allergies   Allergen Reactions    Shellfish Containing Products HIVES    Duloxetine NAUSEA AND VOMITING    Levothyroxine SEE COMMENTS     Caused feeling of weakness    Lisinopril COUGH        Current Medications: Reviewed    Appropriate labs/diagnostic tests: Reviewed    Labs:    24-hour labs:    Results for orders placed or performed during the hospital encounter of 09/08/22 (from the past 24 hour(s))   PTT (APTT)    Collection Time: 09/09/22  4:37 PM   Result Value Ref Range    APTT 37.2 (H) 24.0 - 36.5 SEC   POC GLUCOSE    Collection Time: 09/09/22  5:40 PM   Result Value Ref Range    Glucose, POC 162 (H) 70 - 100 MG/DL   POC GLUCOSE    Collection Time: 09/09/22  8:56 PM   Result Value Ref Range    Glucose, POC 140 (H) 70 - 100 MG/DL   CBC    Collection Time:  09/10/22 12:26 AM   Result Value Ref Range    White Blood Cells 6.4 4.5 - 11.0 K/UL    RBC 4.79 4.4 - 5.5 M/UL    Hemoglobin 13.8 13.5 - 16.5 GM/DL    Hematocrit 29.5 40 - 50 %    MCV 86.6 80 - 100 FL    MCH 28.8 26 - 34 PG    MCHC 33.2 32.0 - 36.0 G/DL    RDW 62.1 (H) 11 - 15 %    Platelet Count 194 150 - 400 K/UL    MPV 8.0 7 - 11 FL   BASIC METABOLIC PANEL    Collection Time: 09/10/22 12:26 AM   Result Value Ref Range    Sodium 141 137 - 147 MMOL/L    Potassium 3.6 3.5 - 5.1 MMOL/L    Chloride 109 98 - 110 MMOL/L    CO2 21 21 - 30 MMOL/L    Anion Gap 11 3 - 12    Glucose 124 (H) 70 - 100 MG/DL    Blood Urea Nitrogen 15 7 - 25 MG/DL    Creatinine 3.08 0.4 - 1.24 MG/DL    Calcium 9.1 8.5 - 65.7 MG/DL    eGFR >84 >69 mL/min   MAGNESIUM    Collection Time: 09/10/22 12:26 AM   Result Value Ref Range    Magnesium 2.0 1.6 - 2.6 mg/dL   LIPID PROFILE    Collection Time: 09/10/22 12:26 AM   Result Value Ref Range    Cholesterol 85 <200 MG/DL    Triglycerides 629 <528 MG/DL    HDL 24 (L) >41 MG/DL    LDL 51 <324 mg/dL    VLDL 25 MG/DL    Non HDL Cholesterol 61 MG/DL   PTT (APTT)    Collection Time: 09/10/22 12:26 AM   Result Value Ref Range    APTT 53.8 (H) 24.0 - 36.5 SEC   PTT (APTT)    Collection Time: 09/10/22  5:52 AM   Result Value Ref Range    APTT 50.4 (H) 24.0 - 36.5 SEC   POC GLUCOSE    Collection Time: 09/10/22  8:14 AM   Result Value Ref Range    Glucose, POC 136 (H) 70 - 100 MG/DL   POC GLUCOSE    Collection Time: 09/10/22 11:49 AM   Result Value Ref Range    Glucose, POC 148 (H) 70 - 100 MG/DL   , Hematology:    Lab Results   Component Value Date    HGB 13.8 09/10/2022    HCT 41.5 09/10/2022    PLTCT 194 09/10/2022    WBC 6.4 09/10/2022    NEUT 77 09/08/2022    ANC 6.26 09/08/2022    ALC 1.17 09/08/2022    MONA 6 09/08/2022    AMC 0.52 09/08/2022    EOSA 1 09/08/2022    ABC 0.06 09/08/2022    MCV 86.6 09/10/2022    MCH 28.8 09/10/2022    MCHC 33.2 09/10/2022    MPV 8.0 09/10/2022    RDW 16.0 09/10/2022   , Coagulation:    Lab Results   Component Value Date    PT 13.3 12/06/2002    PTT 50.4 09/10/2022    INR 1.1 11/10/2018    INR 1.3 12/06/2002   , and General Chemistry:    Lab Results   Component Value Date    NA 141 09/10/2022    K 3.6 09/10/2022    CL 109 09/10/2022  CO2 21 09/10/2022    GAP 11 09/10/2022    BUN 15 09/10/2022    CR 1.06 09/10/2022    GLU 124 09/10/2022    GLU 126 12/08/2002    CA 9.1 09/10/2022    ALBUMIN 3.9 09/08/2022    OBSCA 1.29 12/05/2002    MG 2.0 09/10/2022    TOTBILI 0.6 09/08/2022    PO4 3.3 12/06/2002       Have the patient or anyone in the patient's family ever had a history of sedation/anesthesia complications? No    Jonell Cluck, MD, [facility], [facility]  Interventional Cardiologist

## 2022-09-10 NOTE — Progress Notes
HC5 END OF SHIFT/PLAN OF CARE NURSING NOTE   Nursing Shift: Day Shift 0700-1900    Acute events, nursing interventions, & communication with providers: Pt went to cath lab for [facility] today. Pt received 1 stent to LAD accessed through pt's R groin. Pt returned to room around 1815 and is recovering.       Patient Goal(s)  Patient will Report breathing has improved by the end of next shift.        Patient will  Verbalize readiness for discharge by discharge.   Admission Weight: Weight: 87.3 kg (192 lb 6.4 oz)    Last 3 Weights:   Vitals:    09/09/22 0440 09/09/22 1621 09/10/22 0558   Weight: 86 kg (189 lb 9.6 oz) 85.7 kg (189 lb) 86.5 kg (190 lb 11.2 oz)     Weight Change: Weight trend stable    Intake/Output Summary (Last 24 hours) at 09/10/2022 1834  Last data filed at 09/10/2022 1302  Gross per 24 hour   Intake 1071.01 ml   Output 475 ml   Net 596.01 ml     Last Bowel Movement Date:  (pta)    Fluid Restriction? No   Quality/Safety    Total Fall Risk Score: 11   Fall Risk Level: Moderate (6-13)  Fall Risk Category: Medication, Patient Care Equipment, and Mobility  Risk for Injury related to falls: Coagulopathies/risk for bleed and Surgery/procedure within the last 24 hours  Fall Prevention Interventions: Clutter- free environment, Urinal within reach,  Stay within arm's reach while ambulating, toileting, showering,  PT/OT Consulted,  Mobility Device,  Increase frequency of intentional rounding, and Fall Risk Identifiers - Yellow wristband, yellow socks, sign outside door    Restraints:  No      Patient Education  This RN provided education to Patient and Family today. The following education topics were reviewed:  Quality/Safety Education:   Fall risk  Medication Education:   Cardiac medication(s) and Medication management (Indication, adverse effects, monitoring, etc)  Education provided on the following medication(s): losartan, lasix, heparin gtt, aspirin,   Cardiac - Specific Education:   Post cardiac procedure care: LHC and Cardiac diet/nutrition  General Education:   Diet/nutrition, Discharge planning, Glucose management, Mobility/Activity intolerance, and Tests/Procedures: LHC    The following teaching method(s) were used: Verbal  Response to learning: [employer] and Demonstrated Understanding

## 2022-09-10 NOTE — Progress Notes
Native LAD ostial 80% severely calcified  Circ occluded, OM graft occluded   LIMA not grafted to LAD  SVG to RCA prox tandem 60%, iFR negative  PCI of the LM extending into LAD DES x 1 with IVL  Right CFA access 19F closed with angioseal  Plavix + OAC x 1 yr, then Asa 81 + OAC for life    Jonell Cluck, MD, [facility], [facility]  Interventional Cardiologist

## 2022-09-11 MED ORDER — CARVEDILOL 3.125 MG PO TAB
3.125 mg | Freq: Two times a day (BID) | ORAL | 0 refills | Status: DC
Start: 2022-09-11 — End: 2022-09-13
  Administered 2022-09-11 – 2022-09-13 (×5): 3.125 mg via ORAL

## 2022-09-11 MED ORDER — APIXABAN 5 MG PO TAB
5 mg | Freq: Two times a day (BID) | ORAL | 0 refills | Status: DC
Start: 2022-09-11 — End: 2022-09-13
  Administered 2022-09-11 – 2022-09-13 (×5): 5 mg via ORAL

## 2022-09-11 MED ORDER — SPIRONOLACTONE 25 MG/5 ML PO SUSP
12.5 mg | Freq: Every day | ORAL | 0 refills | Status: DC
Start: 2022-09-11 — End: 2022-09-12
  Administered 2022-09-11 – 2022-09-12 (×2): 12.5 mg via ORAL

## 2022-09-11 NOTE — Progress Notes
Cardiology Daily Progress Note      Patient's Name:  Mark Robbins MRN: [id number]   Today's Date:  09/11/2022  Admission Date: 09/08/2022  LOS: 3 days    Problem list  Principal Problem:    NSTEMI (non-ST elevated myocardial infarction) [facility])  Active Problems:    CAD (coronary artery disease)    Hypertension    GERD (gastroesophageal reflux disease)    Mixed dyslipidemia    Diabetes mellitus (HCC)    Hx of CABG    Permanent atrial fibrillation (HCC)    On apixaban therapy    Pacemaker    Moderate mitral regurgitation    HFrEF (heart failure with reduced ejection fraction) (HCC)    Ischemic cardiomyopathy        ASSESSMENT AND PLAN      Mark Robbins is a 82 y.o. 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 He was transferred to [facility] for NSTEMI and evaluation in cath lab.     09/11/2022 Updates:  - Had left heart catheterization yesterday, and received PCI to the left main extending into the LAD artery with a drug eluding stent.   - New onset reduced LVEF from 65% to 30% with moderate mitral valve insufficiency and heart failure   - Blood pressures have been on the softer side overnight. Remains asymptomatic with this   - Initiation of GDMT has begun. He is currently on carvedilol 3.125mg  BD, losartan 12.5mg , spironolactone 12.5mg   - Discussion made to hold furosemide given softer blood pressures alongside end diastolic pressure being normal on ECHO   - Discussion had regarding entresto - not to be prescribed due to low blood pressures  - Discussion regarding temporary defibrillator vest to be worn for the next 3 months given high risk  - Potential for discharge home tomorrow    NEURO/PSYCH  #No acute concerns  Plan:  > Monitor for signs/symptoms related to neuro/psych dysfunction     PULMONARY  #No acute concerns  - Patient initially with SOB with his chest pain that have both since resolved. Maintaining saturations on room air.  - No home O2 requirements  - SOB with minimal exertion  Plan:   > Monitor for signs/symptoms related to pulmonary dysfunction  > O2 supplementation PRN to maintain >92%     CARDIOVASCULAR  #NSTEMI, type 1  #CAD s/p CABG 2004 (SVG to PDA, reverse SVG to RI, OM1, OM2, and OM3)  #Permanent atrial fibrillation on chronic anticoagulation with apixaban  #Tachy/Brady syndrome with symptomatic bradycardia s/p PPM  #Mitral regurgitation  #HTN  #Mixed Dyslipidemia  - Patient presented to outside [facility] with chest pain and SOA. Reports that pain felt more like a pulled muscle (previous MI felt like an elephant on my chest.). Both resolved in transit to Mark Robbins from OSH  - EKG at OSH without ST elevations or depressions. Troponin 3.085 (not high-sensitivity)  - Admission EKG: Rate-controlled atrial fibrillation with intermittent v-pacing  - 04/2022 Echo: EF 60%; unable to assess diastolic dysfunction, mild to moderate MR, mild TR, aortic valve sclerosis with calcification and mild MI  - 12/2021 Lipid Profile: cholesterol 116, trigs 92, HDL 29, and LDL 69  -  Trop: Mark Robbins admission -> 659/814/765 for 0-/2-/4-hour; BNP was 4087.  - PTA meds: apixaban 5mg  BID, metoprolol tartrate 25mg  BID, losartan 100mg  daily, pravastatin 40mg  qHS  Plan:  > Patient was started on heparin drip at OSH. This was continued on admission to .   > Restarted PTA apixaban   > Left cath with PCI completed 09/10/2022.   > Trending troponin, peaked at 814 (slight rise @2hr  but mostly flat since admission).  > Daily AM BMP and Mg with plans to keep K>/=4 and Mg>/=2. (Repleted K+ today with )  > Will continue PTA metoprolol tartrate 25mg  BID.  > Most recent lipid panel 12/2021: cholesterol 116, trigs 92, HDL 29, and LDL 69. Repeat lipid panel ordered. Plan to switch to rosuvastatin 40mg   > Echo ordered results above      FEN/GI  #GERD  - Symptoms well managed on current regimen.  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 Consideration of CKD2 or 3a should be made based on these findings.  - PTA Medications: tamsulosin 0.4mg  daily and finasteride 5mg  daily  Plan:  > Replace electrolytes PRN  > Monitor kidney function with daily BMP  > Continue PTA tamsulosin and finasteride    ENDOCRINE  #DMT2  - 12/2021 A1c 6.6%  - PTA Medications: Jardiance 25mg  daily  - No prior insulin requirements PTA  Plan:  > Holding PTA Jardiance   > POC ACHS blood sugar checks with LDCF ordered    HEME/ONC  #No acute concerns  Plan:  > Monitor Hgb with daily CBC  > Transfuse if Hgb <7 or <8 with active bleeding  > DVT Ppx: PTA Apixaban    ID  #No acute concerns  - Temp (24hrs), Avg:36.4 ?C (97.6 ?F), Min:36.3 ?C (97.4 ?F), Max:36.6 ?C (97.9 ?F)  Plan:  > Continue to monitor for signs/symptoms of infection     MSK/DERM  No concerns at this time     LDA/PROPHYLAXIS  Lines: PIV (x2)  Tubes: None  Urinary Catheter:  No  Antibiotic Usage:  No  VTE ppx: SCDs, heparin gtt  GI:  PPI  Bowel regimen: Pt having regular BMs  Diet: Cardiac diet  Code status: FULL CODE  Nutrition: No Dietician Consult  Wound: No Wound Consult    DISPO: Continue admission to CV1 pending blood pressure monitoring   Pt's primary caretaker is himself, he lives at home with his wife and is fully independent   PT/OT recommendation: pending    Discussed with Dr. Beather Arbour, MD     Dr. Virgel Bouquet  PGY-1 Internal Medicine  Available on [employer]       SUBJECTIVE:     No acute overnight events. Mr Hufford states he feels well and asymptomatic. He says the procedure went good. We have explained to Mr Stacey during rounds that he has got a reduced ejection fraction which corresponds with heart failure. We have explained that we will optimize medical therapy for this alongside the recommendation to wear a temporary ICD vest given his high risk status.     Review of Systems:  Full ROS completed and negative unless stated above.     OBJECTIVE:     Vital Signs:  BP 93/46 (BP Source: Arm, Right Upper)  - Pulse 51  - Temp 36.8 ?C (98.2 ?F)  - Ht 177.8 cm (5' 10)  - Wt 86.3 kg (190 lb 3.2 oz)  - SpO2 95%  - BMI 27.29 kg/m?     Intake/Output Summary:  (Last 24 hours)    Intake/Output Summary (Last 24 hours) at 09/11/2022 0609  Last data filed at 09/10/2022 2329  Gross per 24 hour   Intake 1090.01 ml   Output 1275 ml   Net -184.99 ml         Physical Exam  HENT:      Mouth/Throat:      Pharynx: Oropharynx is clear.   Eyes:      Pupils: Pupils are equal, round, and reactive to light.   Cardiovascular:      Rate and Rhythm: Normal rate. Rhythm irregular.      Pulses: Normal pulses.   Pulmonary:      Effort: Pulmonary effort is normal.   Abdominal:      General: Abdomen is flat.   Musculoskeletal:         General: Normal range of motion.      Cervical back: Normal range of motion.   Skin:     Capillary Refill: Capillary refill takes less than 2 seconds.   Neurological:      General: No focal deficit present.      Mental Status: He is alert.   Psychiatric:         Mood and Affect: Mood normal.      Labs:  Recent Labs     09/08/22  2210 09/09/22  0358 09/10/22  0026 09/11/22  0401   HGB 15.2 14.4 13.8 14.3   WBC 8.1 7.2 6.4 8.2   PLTCT 173 205 194 195   NA 139 142 141 141   K 5.4* 3.7 3.6 4.1   CL 106 108 109 109   CO2 22 24 21 21    BUN 21 18 15 17    CR 1.14 1.17 1.06 1.42*   GLU 117* 132* 124* 131*   CA 10.0 8.9 9.1 9.5   MG 2.2 1.9 2.0 2.0 ALBUMIN 3.9  --   --   --    AST 26  --   --   --    ALT 9  --   --   --    ALKPHOS 63  --   --   --    TOTBILI 0.6  --   --   --      Meds:  Scheduled Meds:aspirin chewable tablet 81 mg, 81 mg, Oral, QDAY  clopiDOGreL (PLAVIX) tablet 75 mg, 75 mg, Oral, QDAY  finasteride (PROSCAR) tablet 5 mg, 5 mg, Oral, QDAY  [Held by Provider] furosemide (LASIX) tablet 40 mg, 40 mg, Oral, BID(9-17)  insulin aspart (U-100) (NOVOLOG FLEXPEN U-100 INSULIN) injection PEN 0-6 Units, 0-6 Units, Subcutaneous, ACHS (22)  losartan (COZAAR) tablet 12.5 mg, 12.5 mg, Oral, QDAY  melatonin tablet 5 mg, 5 mg, Oral, QHS  metoprolol tartrate tablet 25 mg, 25 mg, Oral, BID  pantoprazole DR (PROTONIX) tablet 40 mg, 40 mg, Oral, QDAY(21)  rosuvastatin (CRESTOR) tablet 20 mg, 20 mg, Oral, QHS  tamsulosin (FLOMAX) capsule 0.4 mg, 0.4 mg, Oral, QDAY after breakfast    Continuous Infusions:   heparin (porcine) 20,000 units/D5W 500 mL infusion (std  conc)(premade) 1,100 Units/hr (09/10/22 1433)     PRN and Respiratory Meds:acetaminophen Q4H PRN, aluminum-magnesium hydroxide Q4H PRN, dextrose 50% (D50) IV PRN, diclofenac sodium QID PRN, diphenhydrAMINE HCL Q4H PRN **OR** diphenhydrAMINE HCL Q4H PRN, heparin (porcine) TITRATE **AND** heparin (porcine) Q6H PRN, ondansetron (ZOFRAN) IV Q6H PRN, traZODone QHS PRN

## 2022-09-11 NOTE — Progress Notes
HC5 END OF SHIFT/PLAN OF CARE NURSING NOTE   Nursing Shift: Day Shift 0700-1900    Acute events, nursing interventions, & communication with providers: No acute events.      Patient Goal(s)  Patient will Verbalize readiness for discharge by the end of next shift.        Patient will  Verbalize readiness for discharge by discharge.   Admission Weight: Weight: 87.3 kg (192 lb 6.4 oz)    Last 3 Weights:   Vitals:    09/09/22 1621 09/10/22 0558 09/11/22 0400   Weight: 85.7 kg (189 lb) 86.5 kg (190 lb 11.2 oz) 86.3 kg (190 lb 3.2 oz)     Weight Change: Weight trend stable    Intake/Output Summary (Last 24 hours) at 09/11/2022 1806  Last data filed at 09/11/2022 1224  Gross per 24 hour   Intake 1099 ml   Output 1275 ml   Net -176 ml     Last Bowel Movement Date: 09/07/22    Fluid Restriction? No   Quality/Safety    Total Fall Risk Score: 12   Fall Risk Level: Moderate (6-13)  Fall Risk Category: Medication and Mobility  Risk for Injury related to falls: Surgery/procedure within the last 24 hours  Fall Prevention Interventions:  Stay within arm's reach while ambulating, toileting, showering and Fall Risk Identifiers - Yellow wristband, yellow socks, sign outside door    Other safety precautions in place: N/A    Restraints:  No      Patient Education  This RN provided education to Patient today. The following education topics were reviewed:  Quality/Safety Education:   Fall risk  Medication Education:   First dose medication(s)  Education provided on the following medication(s): coreg  Cardiac - Specific Education:   Post cardiac procedure care: Heart cath  [employer] Education:   Discharge planning    The following teaching method(s) were used: Verbal  Response to learning: [employer]  Needs reinforcement on: N/A

## 2022-09-11 NOTE — Progress Notes
He is feeling well today and is not having chest discomfort or shortness of breath.    He had hypotension documented between midnight and 4:00 this morning but his most recent BP was around 110 mmHg systolic.    We have made adjustments in his blood pressure active medications but I suspect his blood pressures will probably limit our titration somewhat.    Given his ischemic cardiomyopathy, coronary artery disease including recent acute non-ST elevation MI, I think he is at significant risk for sudden cardiac arrest and while we are titrating guideline directed medical therapy post-MI, I think a LifeVest wearable defibrillator is clearly indicated.  We discussed this today in detail and I strongly recommended it.  He is open to using a LifeVest and we will get it arranged prior to DC, which I anticipate will be tomorrow.    The complexity of medical decision making remains high due to complex cardiovascular as well as other issues present and despite improvements, significant near-term risks as above.

## 2022-09-11 NOTE — Progress Notes
PHYSICAL THERAPY  ASSESSMENT      Name: Mark Robbins   MRN: [id number]     DOB: 09/20/1940      Age: 82 y.o.  Admission Date: 09/08/2022     LOS: 3 days     Date of Service: 09/11/2022      Mobility  Progressive Mobility Level: Walk in room  Distance Walked (feet): 40 ft  Level of Assistance: Assist X1  Assistive Device: Cane  Activity Limited By: Weakness;Patient request to stop;Fatigue    Subjective  Significant Hospital Events: 82 y.o. 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 He was transferred to [facility] for NSTEMI and evaluation in cath lab. s/p LHC and PCI 8/10  Mental / Cognitive: Alert;Oriented;Cooperative;Follows commands  Persons Present: Family    Home Living Situation  Lives With: Alone  Type of Home: House  Entry Stairs: 1;Rail on right when ascending  In-Home Stairs: Able to live on main level  Bathroom Setup: Walk in shower (with seat)  Patient Owned Equipment: Cane: Single point;Walker: Rollator    Prior Level of Function  Level Of Independence: Independent with ADL and community mobility with device  Required Assist For: All home functioning ADL  History of Falls in Past 3 Months: No  Comments: Pt previously modified independent using a cane or rollator. Pt mostly ambulates household distances, though does drive andgoes to [place], navigating around the store in electric cart. Pt reports he has home aide provided throught the [place] that assists with homemaking, cleaning, etc.    ROM  R LE ROM: WFL  R LE ROM Method: Active  L LE ROM: WFL  L LE ROM Method: Active    Strength  Overall Strength: Generalized weakness    Bed Mobility/Transfer  Comments: Pt in bedside chair at beginning and end of session.  Transfer Type: Sit to/from Stand  Transfer: Assistance Level: To/From;Bed Side Chair (contact guard assist)  End Of Activity Status: Up in Chair;Nursing Notified;Instructed Patient to Request Assist with Mobility;Instructed Patient to Use Call Light  Comments: Increased time to complete transfers, able to do so without physical assistance    Gait  Gait Distance: 40 feet  Gait: Assistance Level: Minimal Assist  Gait: Assistive Device: Single [employer]  Gait: Descriptors: Pace: Slow;Swing-Through Gait;Pathway deviations;Forward trunk flexion;Variable step length  Comments: Pt ambulates with signficant forward trunk flexion and lateral trunk sway. Pt denies pain in LE and demonstrates no antalgia. Limited by fatigue, weakness. Pt (and family confirms) reports gait quality is similar to baseline. Provided roller walker as this will likely provide more overall smoothness of gait quality. Pt is comfortable with use of walker.  Activity Limited By: Complaint of Fatigue;Weakness    Education  Persons Educated: Patient  Patient Barriers To Learning: None Noted  Teaching Methods: Verbal Instruction  Patient Response: Verbalized Understanding  Topics: Plan/Goals of PT Interventions;Safety Awareness;Up with Assist Only;Importance of Increasing Activity;Ambulate With Nursing;Recommend Continued Therapy    Assessment/Progress  Impaired Mobility Due To: Decreased Activity Tolerance;Deconditioning  Impaired Strength Due To: Decreased Activity Tolerance;Deconditioning  Assessment/Progress: Should Improve w/ Continued PT  Comments: Pt appears to be near baseline level of fucntional mobility.  AM-PAC 6 Clicks Basic Mobility Inpatient  Turning from your back to your side while in a flat bed without using bed rails: None  Moving from lying on your back to sitting on the side of a flat bed without using bedrails : None  Moving to and from a bed to a chair (including a wheelchair): A Little  Standing up from a chair using your arms (e.g. wheelchair, or bedside chair): A Little  To walk in hospital room: A Little  Climbing 3-5 steps with a railing: A Little  Basic Mobility Inpatient Raw Score: 20  Standardized (T-scale) Score: 43.99    Goals  Goal Formulation: With Patient  Patient Will Go Supine To/From Sit: w/ Stand By Assist  Patient Will Transfer Bed/Chair: w/ Stand By Assist  Patient Will Transfer Sit to Stand: w/ Stand By Assist  Patient Will Ambulate: 101-150 Feet, w/ Stand By Assist (w/ least restrictive device)    Plan  Treatment Interventions: Mobility training;Strengthening;ROM;Balance activities;Coordination training;Endurance training  Plan Frequency: 5 Days per Week  PT Plan for Next Visit: Progress gait (trial walker for longer distances), endurance, repeat transfers    PT Discharge Recommendations  Recommendation: Home with intermittent supervision/assistance  Patient Currently Requires Equipment: Owns what is needed  Comments: pt declines referral for [facility] PT    Therapist  Berenice Bouton, PT, DPT  Date  09/11/2022

## 2022-09-12 MED ORDER — LOSARTAN 25 MG PO TAB
12.5 mg | ORAL_TABLET | Freq: Every day | ORAL | 0 refills | Status: CN
Start: 2022-09-12 — End: ?

## 2022-09-12 MED ORDER — SPIRONOLACTONE 25 MG PO TAB
12.5 mg | Freq: Every day | ORAL | 0 refills | Status: DC
Start: 2022-09-12 — End: 2022-09-13
  Administered 2022-09-13: 14:00:00 12.5 mg via ORAL

## 2022-09-12 MED ORDER — SPIRONOLACTONE 25 MG PO TAB
12.5 mg | ORAL_TABLET | Freq: Every day | ORAL | 0 refills | Status: CN
Start: 2022-09-12 — End: ?

## 2022-09-12 MED ORDER — CARVEDILOL 3.125 MG PO TAB
3.125 mg | ORAL_TABLET | Freq: Two times a day (BID) | ORAL | 0 refills | Status: CN
Start: 2022-09-12 — End: ?

## 2022-09-12 MED ORDER — CLOPIDOGREL 75 MG PO TAB
75 mg | ORAL_TABLET | Freq: Every day | ORAL | 0 refills | Status: CN
Start: 2022-09-12 — End: ?

## 2022-09-12 MED ORDER — ROSUVASTATIN 20 MG PO TAB
20 mg | ORAL_TABLET | Freq: Every evening | ORAL | 0 refills | Status: CN
Start: 2022-09-12 — End: ?

## 2022-09-12 NOTE — Care Plan
Problem: Discharge Planning  Goal: Participation in plan of care  Outcome: Goal Ongoing  Goal: Knowledge regarding plan of care  Outcome: Goal Ongoing  Goal: Prepared for discharge  Outcome: Goal Ongoing     Problem: Moderate Fall Risk  Goal: Moderate Fall Risk  Outcome: Goal Ongoing

## 2022-09-12 NOTE — Case Management (ED)
Case Management Progress Note    NAME:Mark Robbins                          MRN: [id number]              DOB:1940-02-09          AGE: 82 y.o.  ADMISSION DATE: 09/08/2022             DAYS ADMITTED: LOS: 4 days      Today's Date: 09/12/2022    PLAN: Continue inpatient admission     NCM contacted by bedside RN stating that patient is ready to DC and will require a life vest for DC.     NCM sent referral to [employer] life vest and contacted [employer], [place]. Per discussion with Susy Frizzle, they have a short authorization and review window on Sundays.  Patient will likely not be able to DC until Monday following [employer] life vest education and fitting.     NCM to continue to follow and assist with DC planning.     Expected Discharge Date: 09/12/2022 2:00 PM  Is Patient Medically Stable: Yes   Are there Barriers to Discharge? yes (Life vest set up)    INTERVENTION/DISPOSITION:  Discharge Planning               Patient to DC home with life vest  Transportation              Does the Patient Need Case Management to Arrange Discharge Transport? (ex: facility, ambulance, wheelchair/stretcher, Medicaid, cab, other): No  Will the Patient Use Family Transport?: Yes  Transportation Name, Phone and Availability #1: son  Support                 Info or Referral                 Positive SDOH Domains and Potential Barriers         Medication Needs                                  Financial                 Legal                 Other                 Discharge Disposition                      Selected Continued Care - Admitted Since 09/08/2022    No services have been selected for the patient.           Nancee Liter BSN, RN Case Manager  [facility]  Voalte: Nancee Liter

## 2022-09-12 NOTE — Discharge Instructions - Pharmacy
Discharge Summary      Name: Mark Robbins  Medical Record Number: [id number]        Account Number:  [id number]  Date Of Birth:  Apr 23, 1940                         Age:  82 y.o.  Admit date:  09/08/2022                     Discharge date: 09/13/2022      Discharge Attending:  Mady Gemma   Discharge Summary Completed By: Virgel Bouquet, MBBS    Service: Cardiology Service - [id number]    Reason for hospitalization:  NSTEMI (non-ST elevated myocardial infarction) [facility]) [I21.4]    Primary Discharge Diagnosis:   NSTEMI (non-ST elevated myocardial infarction) [facility])    Hospital Diagnoses:  Hospital Problems        Active Problems    * (Principal) NSTEMI (non-ST elevated myocardial infarction) (HCC)    CAD (coronary artery disease)    Hypertension    GERD (gastroesophageal reflux disease)    Mixed dyslipidemia    Diabetes mellitus (HCC)    Hx of CABG    Permanent atrial fibrillation (HCC)    On apixaban therapy    Pacemaker    Moderate mitral regurgitation    HFrEF (heart failure with reduced ejection fraction) (HCC)    Ischemic cardiomyopathy     Present on Admission:   CAD (coronary artery disease)   Hypertension   GERD (gastroesophageal reflux disease)   Mixed dyslipidemia   Diabetes mellitus (HCC)   Hx of CABG   On apixaban therapy   Pacemaker   Moderate mitral regurgitation   NSTEMI (non-ST elevated myocardial infarction) (HCC)   HFrEF (heart failure with reduced ejection fraction) (HCC)        Significant Past Medical History        Diabetes mellitus (HCC)  Dyspnea  GERD (gastroesophageal reflux disease)  Hyperlipemia  Hypertension    Allergies   Shellfish containing products, Duloxetine, Levothyroxine, and Lisinopril    Brief Hospital Course     Mark Robbins is a pleasant 82 year old gentleman with a past medical history of     CAD s/p CABG (2004)  Perm Afib  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 He was transferred to [facility] for NSTEMI management and evaluation in the cath lab.    He received a left heart catheterization on 09/10/22, and was found to have native LAD ostial 80% severely calcified, he received a PCI to the left main vessel extending into the LAD.     Mark Robbins is to be on plavix and apixaban for 1 year, he is then to transition to aspirin and apixaban for life. He is NOT to re-start celecoxib due to the risk of GI bleeding.     An ECHO was done showing new onset reduced LVEF from 65% to 30% with moderate MV insufficiency.     He was initiated on GDMT. We changed his beta blocker to carvedilol 3.125mg  BD, losartan 12.5, spironolactone 12.5mg     A discussion was had between the medical team not to initiate entresto or furosemide due to blood pressures being on the lower end of normal. He is scheduled  for a heart failure nurse follow up in 1 weeks time therefore up-titration of those medications may occur.    Given his high risk coronary nature, Mark. Robbins is to be discharged with a temporary ICD vest which we would like him to wear for 3 months. He is to be discharged with appropriate follow up and has an appointment with our heart failure nurse Mark Robbins on August 15th 2024 at 11am, and our coronary nurse specialist, Mark Robbins on 21st August at 11am.     We have referred him to a cardiac rehab program in his home town of [place], and they will be in contact with him for organization     Items Needing Follow Up   Pending items or areas that need to be addressed at follow up:   We encourage Mark Robbins to schedule a follow up with his PCP post discharge     Pending Labs and Follow Up Radiology    Pending labs and/or radiology review at this time of discharge are listed below: if this area is blank, there are no items for review.         Medications      Medication List      START taking these medications     carvediloL 3.125 mg tablet; Commonly known as: COREG; Dose: 3.125 mg;   Take one tablet by mouth twice daily. Take with food.  Indications:   myocardial reinfarction prevention; For: myocardial reinfarction   prevention; Quantity: 180 tablet; Refills: 0   clopiDOGreL 75 mg tablet; Commonly known as: PLAVIX; Dose: 75 mg; Take   one tablet by mouth daily. Indications: treatment to prevent a heart   attack; For: treatment to prevent a heart attack; Quantity: 90 tablet;   Refills: 0; Start taking on: September 14, 2022   rosuvastatin 20 mg tablet; Commonly known as: CRESTOR; Dose: 20 mg; Take   one tablet by mouth at bedtime daily. Indications: hardening of the   arteries due to plaque buildup, treatment to prevent a heart attack; For:   hardening of the arteries due to plaque buildup, treatment to prevent a   heart attack; Quantity: 90 tablet; Refills: 0   spironolactone 25 mg tablet; Commonly known as: ALDACTONE; Dose: 12.5   mg; Take one-half tablet by mouth daily. Take with food.  Indications:   heart failure with reduced ejection fraction; For: heart failure with   reduced ejection fraction; Quantity: 90 tablet; Refills: 0; Start taking   on: September 14, 2022     CHANGE how you take these medications     losartan 25 mg tablet; Commonly known as: COZAAR; Dose: 12.5 mg; Take   one-half tablet by mouth daily. Indications: chronic heart failure; For:   chronic heart failure; Quantity: 90 tablet; Refills: 0; Start taking on:   September 14, 2022; What changed: medication strength, how much to take     CONTINUE taking these medications     acetaminophen 500 mg tablet; Commonly known as: TYLENOL EXTRA STRENGTH;   Dose: 500 mg; Refills: 0   apixaban 5 mg tablet; Commonly known as: ELIQUIS; Dose: 5 mg; Take one   tablet by mouth twice daily.; Quantity: 60 tablet; Refills: 11   CHOLEcalciferoL (vitamin D3) 1,000 units tablet; Dose: 1,000 Units;   Refills: 0   cyanocobalamin (vitamin B-12) 1,000 mcg capsule; Dose: 1,000 mcg;   Refills: 0   diclofenac sodium 1 % topical gel; Commonly known as: VOLTAREN; Dose: 4   g; Refills: 0  empagliflozin 25 mg tablet; Commonly known as: JARDIANCE; Dose: 25 mg;   Refills: 0   finasteride 5 mg tablet; Commonly known as: PROSCAR; Dose: 5 mg;   Refills: 0   lidocaine 5 % topical patch; Commonly known as: LIDODERM; Dose: 1 patch;   Refills: 0   Meclizine 25 mg Chew; Dose: 25 mg; Refills: 0   nitroglycerin 0.4 mg tablet; Commonly known as: NITROSTAT; Dose: 0.4 mg;   Refills: 0   omeprazole DR 20 mg capsule; Commonly known as: PriLOSEC; Dose: 20 mg;   Refills: 0   tamsulosin 0.4 mg capsule; Commonly known as: FLOMAX; Dose: 0.4 mg;   Refills: 0     STOP taking these medications     celecoxib 200 mg capsule; Commonly known as: CeleBREX   hydroCHLOROthiazide 25 mg tablet; Commonly known as: HYDRODIURIL   metoprolol tartrate 25 mg tablet   pravastatin 80 mg tablet; Commonly known as: PRAVACHOL       Return Appointments and Scheduled Appointments     Scheduled appointments:      Sep 16, 2022 11:00 AM  Follow-up visit with Mark Bonine, APRN-NP  Cardiovascular Medicine: [facility] (CVM Exam) [address]  [place] [address]  [phone]     Sep 22, 2022 11:00 AM  Office visit with Mark Leeds, APRN-NP  Cardiovascular Medicine: [facility] [place] (CVM Exam) [address]  [address], [address]  [place] [address]  [phone]            Consults, Procedures, Diagnostics, Micro, Pathology   Consults: None  Surgical Procedures & Dates:  09/10/22 - Left heart cath, selective right and left native coronary angiography, selective venous bypass graft angiography, selective LIMA graft angiography, intravenous ultrasound of the left main and LAD, PCI of the native left main extending into the left anterior descending artery with a single dose-eluting stent. Intravascular intracoronary lithotripsy with a 4.86mm ostial LAD/left main plaque modification with shockwave C2+ lithotripsy for a severely calcified native aortic artery prior to stenting   Significant Diagnostic Studies, Micro and Procedures: noted in brief hospital course  Significant Pathology: noted in brief hospital course              Wound:      Wounds Abrasion Left Thigh (Active)   09/08/22 2349   Wound Type: Abrasion   Orientation: Left   Location: Thigh   Wound Location Comments:    Initial Wound Site Closure:    Initial Dressing Placed:    Initial Cycle:    Initial Suction Setting (mmHg):    Pressure Injury Stages:    Pressure Injury Present Within 24 Hours of Hospital Admission:    If This Pressure Injury Is Suspected to Be Device Related, Please Select the Device::    Is the Wound Open or Closed:    Wound Assessment Dry;Pink 09/13/22 0423   Peri-wound Assessment Dry;Intact 09/13/22 0423   Wound Drainage Amount None 09/13/22 0423   Wound Dressing Status Intact 09/13/22 0423   Number of days: 5       Puncture Wound (Sheath) Right Femoral (Active)   09/10/22 1715   Puncture Site: Orientation: Right   Puncture Site: Location: Femoral   Closure Device/Hemostatic Agent Angio-seal 09/11/22 0614   Hemostasis Time 1700 09/11/22 0614   Puncture Wound (Sheath) WDL WDL 09/13/22 0423   Number of days: 3  Discharge Disposition, Condition   Patient Disposition: Discharge home   Condition at Discharge: Stable    Code Status     Code Status History       Date Active Date Inactive Code Status Order ID          11/10/2018 [id number] 11/11/2018 1118 Full Code [id number]  Mark Maser, APRN-NP Inpatient            Patient Instructions     Activity       Activity as Tolerated   As directed      It is important to keep increasing your activity level after you leave the hospital.  Moving around can help prevent blood clots, lung infection (pneumonia) and other problems.  Gradually increasing the number of times you are up moving around will help you return to your normal activity level more quickly.  Continue to increase the number of times you are up to the chair and walking daily to return to your normal activity level. Begin to work towards your normal activity level at discharge.          Diet       Cardiac Diet   As directed      Limiting unhealthy fats and cholesterol is the most important step you can take in reducing your risk for cardiovascular disease.  Unhealthy fats include saturated and trans fats.  Monitor your sodium and cholesterol intake.  Restrict your sodium to 2g (grams) or 2000mg  (milligrams) daily, and your cholesterol to 200mg  daily.    If you have questions regarding your diet at home, you may contact a dietitian at [phone].             Signs and Symptoms:   Report these signs and symptoms       Report These Signs and Symptoms   As directed      Please contact your doctor if you have any of the following symptoms: *Continue medicines as direct on your discharge medication list. Get an up to date list with every visit and take as instructed. Do NOT stop taking any medications without speaking with your doctor or nurse who knows you.    *Remember to weigh yourself first thing in the morning after using the restroom and write it down. Take this record to your doctor appointment.    *Chart symptoms such as fatigue, trouble breathing, or swelling. Call your doctor right away if these symptoms get worse.    *Remember, do not eat more than 2000 milligrams of sodium a day. Watch out for packaged, processed, canned and restaurant foods.    Call your doctor (your cardiologist, if you have one) if:  -you gain more than 2 pounds in 24 hours.  -you gain more than 5 pounds in 1 week.  -any of your symptoms get worse  Do NOT wait to let your doctor know about these changes. and uncontrolled pain, persistent nausea and/or vomiting, difficulty breathing, chest pain, severe abdominal pain, or headache        , Education:   Education       Cardiac Rehab   As directed      Your physician has referred you to outpatient cardiac rehab which will be at [facility]. They will call you to set off times.    Heart Failure Information   As directed      You are at risk for readmission to the hospital because of fluid overload. There are steps you can  take to lower your risk:  *Continue your current heart medications. Changes made have been made during your hospital stay.  *Remember to weigh yourself every morning, first thing after you urinate, and write down your weigh. Take this record to your doctor's appointments.  *Chart your symptoms - fatigue, shortness of breath, swelling, etc. Immediately report any worsening.  *Remember to consume no more than 2,000mg  (milligrams) of sodium daily. Watch out for packaged, processed, canned, and restaurant foods especially.  *If any of your symptoms worsen, or if you gain more than 2 pounds in 24 hours, or 5 pounds in a week, call your cardiologist immediately. EARLY REPORTING OF THESE CHANGES IS VERY IMPORTANT.   Condition Code   Ordering Mode Outpatient    Risk Reduction Plan   As directed      Cardiac Event Personal Risk Factor Reduction Plan  **Take this sheet to your physician to show treatment recommendations**  GARVIS DOWNUM  Admission Date: 09/08/2022 LOS: @LOS @       Blood Pressure Risk  Goal: Keep blood pressure below 130/80  Your Numbers:  BP Readings from Last 1 Encounters:  09/13/22 : 107/75     Plan: High blood pressure is the single most important risk factor for cardiac events because it's the #1 cause of cardiac events.  Take medication as prescribed and monitor your blood pressure.    Abnormal lipids (fats in blood) Risk   Goal: Total Cholesterol: <200  LDL (bad cholesterol): primary prevention <100   LDL (bad cholesterol): secondary prevention < 70  HDL (good cholesterol): >40 for men, >50 for women  Triglycerides: <150  Your Numbers: Cholesterol       Date                     Value               Ref Range           Status                09/10/2022               85                  <200 MG/DL          Final ----------  LDL       Date                     Value               Ref Range           Status                09/10/2022               51                  <100 mg/dL          Final            ----------  HDL       Date                     Value               Ref Range           Status  09/10/2022               24 (L)              >40 MG/DL           Final            ----------  Triglycerides       Date                     Value               Ref Range           Status                09/10/2022               123                 <150 MG/DL          Final            ----------  Plan: Diets high in saturated fat, trans fat and cholesterol can raise blood cholesterol levels increasing your risk of having a cardiac event.  Take medication as prescribed and eat a heart healthy, low sodium diet.    Smoking Risk   Goals: If you smoke, STOP!   Plan: Smoking DOUBLES your risk of having a cardiac event. Quitting can greatly reduce your risk. To register for smoking cessation program call 1-800-QUIT-NOW (937-742-6878) or visit www.smokefree.gov    Diabetes Risk   Goal: Non-diabetic: Below 5.7%  Goal for diabetic: Less than 7%  Your Numbers: Hemoglobin A1C       Date                     Value               Ref Range           Status                09/10/2022               7.2 (H)             4.0 - 5.7 %         Final              Comment:    The ADA recommends that most patients with type 1 and type 2 diabetes maintain     an A1c level <7%.      ----------  Plan: If you have diabetes, even if treated, you are at an increased risk of having a cardiac event.     Alcohol Use Risk  Goal: Alcohol use can lead to a cardiac event.  Plan: For men, limit intake to no more than 2 drinks per day.  For women, limit intake to no more than one drink per day.    Weight Management Risk   Goal: Healthy: BMI is 18.5 to 24.9  Overweight: BMI is 25 to 29.9  Obese: BMI is 30 or higher  Morbid Obesity: 40 or higher  Your Numbers: Your BMI (Calculated): 27.12  Plan: If you have questions about your diet after you go home, you can call a dietitian at [phone]    Physical Activity Risk   Goal: Patients should have approval by a physician prior to beginning an exercise program.  Plan: Try to get at least 30 minutes of  moderate physical activity five days a week or 20 minutes of vigorous physical activity three days a week with your doctor's approval.    Knowing the signs and symptoms of stroke and understanding the risk factors that can lead to both a heart attack and stroke are important for staying healthy. Heart attacks and strokes have a significant overlap, as many of the same lifestyle, family history, and health conditions can increase the chances of both cardiovascular and stroke events.    B.E. F.A.S.T. is an easy way to remember the sudden signs of stroke.    B.E. F.A.S.T. Stands for...    B  -  Balance  Is there a sudden difficulty with balance or coordination? Is walking or sitting difficult?  E  -  Eyes  Is there a sudden change in eyesight such as blurred vision, double vision, or a loss of vision in one or both eyes without pain?   F  -  Face   Does one side of the face droop or is it numb?  Ask the person to smile.  A  -  Arm   Is one arm weak or numb?  Ask the person to raise both arms.  Does one arm drift downward?  S  -  Speech   Is speech slurred or difficult to understand?  Are they unable to speak at all?  Ask the person to repeat is simple sentence like It is a bright and sunny day.    T  -  Time to call 911!  If a person shows any of these symptoms, even if they go away, call 911 to get them to the hospital immediately.  Time is very important.  The sooner they get to the hospital, the more likely they are to receive stroke reversing and potentially life-saving treatment.        , and Others Instructions:   Other Orders       Questions About Your Stay   Complete by: As directed      Discharging attending physician: Mady Gemma    Order comments: For questions or concerns regarding your hospital stay:    - DURING BUSINESS HOURS (8:00 AM - 4:30 PM):  Call [phone] and asked to be transferred to your discharge attending physician.    - AFTER BUSINESS HOURS (4:30 PM - 8:00 AM, on weekends, or holidays):  Call [phone] and ask the operator to page the on-call doctor for the discharge attending physician.            Additional Orders: Case Management, Supplies, Home Health     Home Health/DME       None          Signed:  Virgel Bouquet, MBBS  09/13/2022      cc:    Pricilla Loveless  Dr. Nickolas Madrid, MD     Primary Care Physician:  Erskine Emery   Verified    Additional provider(s):        Did we miss something? If additional records are needed, please fax a request on office letterhead to [phone]. Please include the patient's name, date of birth, fax number and type of information needed. Additional request can be made by email at [email or web address]. For general questions of information about electronic records sharing, call [phone].

## 2022-09-12 NOTE — Case Management (ED)
Received task to do a benefits check to see if pt's VA insurance will cover his whole hospital stay per the request of SWCM Talyn Helman. Informed SW that I was unbale to check VA benefits due VA is not on the chart. Also, does not look like the [place] sent him here nor did they do a notification to the [place].       Junious Dresser  Lead Case Management Assistant  For additional assistance please contact SWCM within this note.

## 2022-09-12 NOTE — Progress Notes
He reports doing well today and has had no recurrence of the chest discomfort or shortness of breath.    He is concerned about his up-and-down BP, but we have not seen any frank hypotension--just low normal readings and he is not having symptomatic postural or other hypotension.    BP 102/58 (BP Source: Arm, Left Upper)  - Pulse 63  - Temp 36.3 ?C (97.3 ?F)  - Ht 177.8 cm (5' 10)  - Wt 85.9 kg (189 lb 6 oz)  - SpO2 98%  - BMI 27.17 kg/m?      We talked about the importance of guideline directed medical therapy as below for management of his MI and prevention of decompensated HF and worsening of his ischemic cardiomyopathy, in conjunction with his revascularization that has already taken place.                                                         Acute MI Cardiology Management  Aspirin 81 mg Yes--but this will not be continued at DC (no triple therapy beyond the hospitalization)   Dual Antiplatelet Therapy  For STEMI/stent: Clopidogrel/ticagrelor/prasugrel  For medically managed Non-STEMI: clopidogrel/ticagrelor Clopidogrel 75 mg daily   Beta blocker Carvedilol   ACE/ARB/ARNI Yes given EF of ~30%    Aldosterone blocking agent  (Start if on ACE/ARB/ARNI and Beta Blocker and EF </= 40% and DM or Heart Failure) Yes   High intensity statin Yes, Rosuvastatin 20mg      EF assessment during admission Yes   Last EF: Lab Results   Component Value Date/Time    SBEF 34 09/09/2022 04:22 PM      Cardiac rehab referral for in ALL STEMI/Non-STEMI patients (unless type 2 MI), even if no intervention Yes      We will plan to resume his empagliflozin at discharge, which he was already taking for diabetes.    I strongly urged him to participate in outpatient cardiac rehab and he is open to doing so.  Our team has already visited with him and are placing the referral to [facility], which is much closer to his home    We talked again about the LifeVest wearable defibrillator and it apparently will not be able to be placed today--we have been told that it is still pending approval, but there is anticipation that it will be placed tomorrow morning.    I encouraged him to continue sitting up in the chair as much as tolerable, and also doing some walking with assistance in the hallways so we can be sure he is able to ambulate at home after discharge without lightheadedness, unsteadiness or falling.    Extensive discussion today about his complex issues and all questions were answered.

## 2022-09-12 NOTE — Progress Notes
HC5 END OF SHIFT/PLAN OF CARE NURSING NOTE   Nursing Shift: Day Shift 0700-1900    Acute events, nursing interventions, & communication with providers: No acute events. Walked patient around room no complains of dizziness.       Patient Goal(s)  Patient will Verbalize readiness for discharge by the end of next shift.        Patient will  Verbalize readiness for discharge by discharge.   Admission Weight: Weight: 87.3 kg (192 lb 6.4 oz)    Last 3 Weights:   Vitals:    09/11/22 0400 09/12/22 0500 09/12/22 0639   Weight: 86.3 kg (190 lb 3.2 oz) 85.9 kg (189 lb 6 oz) 85.9 kg (189 lb 6 oz)     Weight Change: Weight trend stable    Intake/Output Summary (Last 24 hours) at 09/12/2022 1812  Last data filed at 09/12/2022 0900  Gross per 24 hour   Intake 630 ml   Output 570 ml   Net 60 ml     Last Bowel Movement Date: 09/07/22    Fluid Restriction? No   Quality/Safety    Total Fall Risk Score: 12   Fall Risk Level: Moderate (6-13)  Fall Risk Category: Medication and Mobility  Risk for Injury related to falls: Standard risk for injury based on the ABCS scoring tool  Fall Prevention Interventions:  Stay within arm's reach while ambulating, toileting, showering and Fall Risk Identifiers - Yellow wristband, yellow socks, sign outside door    Other safety precautions in place: N/A    Restraints:  No      Patient Education  This RN provided education to Patient and Family today. The following education topics were reviewed:  Quality/Safety Education:   Fall risk  Medication Education:   First dose medication(s)  Education provided on the following medication(s): Coreg    Cardiac - Specific Education:   Cardiac diet/nutrition  General Education:   Mobility/Activity intolerance    The following teaching method(s) were used: Verbal  Response to learning: [employer]  Needs reinforcement on: N/A

## 2022-09-12 NOTE — Progress Notes
Heart Failure Nursing Progress Note    Admission Date: 09/08/2022  LOS: 4 days    Admission Weight: 87.3 kg (192 lb 6.4 oz)        Most recent weights (inpatient):   Vitals:    09/10/22 0558 09/11/22 0400 09/12/22 0639   Weight: 86.5 kg (190 lb 11.2 oz) 86.3 kg (190 lb 3.2 oz) 85.9 kg (189 lb 6 oz)     Weight change from previous day: - 0.3 kg    Fluid restriction ordered: none    Intake/Output Summary: (Last 24 hours)    Intake/Output Summary (Last 24 hours) at 09/12/2022 1610  Last data filed at 09/12/2022 0400  Gross per 24 hour   Intake 930 ml   Output 595 ml   Net 335 ml       Is patient incontinent No    Heart Failure Education provided: Yes     - Heart Failure Zone Sheet and daily weight discussed  - Medications (including diuretics) reviewed during administration  - Fluid restriction discussed    Anticipated discharge date: Today  Discharge goals: ongoing      Daily Assessment of Patient Stated Goals:    Short Term Goal Identified by patient (Short Term=during hospital stay) gain independence

## 2022-09-12 NOTE — Progress Notes
Cardiology Daily Progress Note      Patient's Name:  Mark Robbins MRN: [id number]   Today's Date:  09/12/2022  Admission Date: 09/08/2022  LOS: 4 days    Problem list  Principal Problem:    NSTEMI (non-ST elevated myocardial infarction) [facility])  Active Problems:    CAD (coronary artery disease)    Hypertension    GERD (gastroesophageal reflux disease)    Mixed dyslipidemia    Diabetes mellitus (HCC)    Hx of CABG    Permanent atrial fibrillation (HCC)    On apixaban therapy    Pacemaker    Moderate mitral regurgitation    HFrEF (heart failure with reduced ejection fraction) (HCC)    Ischemic cardiomyopathy      ASSESSMENT AND PLAN      Mark Robbins is a 82 y.o. 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 He was transferred to [facility] for NSTEMI and evaluation in cath lab.     09/12/2022 Updates:  - Had left heart catheterization 2 days ago, received PCI to left main extending into LAD with drug eluding stent.  - New onset reduced LVEF from 65% to 30%  - Blood pressures overnight 92/55, 99/64, 123/67  - GDMT initiation has begun. Currently on carvedilol 3.125mg  BD, losartan 12.5mg , spironolactone 12.5mg .   Discussion had NOT to initiate entresto or furosemide due to lower pressures alongside normal EDP seen on ECHO.  - Awaiting temporary ICD vest. Spoke to [facility] from [place] who has said patient unlikely to receive today but definitely tomorrow morning with appropriate education  - Awaiting cardiac rehab consult   - Likely home tomorrow post ICD vest placement.  - In terms of follow up, Mark Robbins has a heart failure RN f/u and coronary RN f/u booked. Cardiac rehab to be organised by SW tomorrow.    NEURO/PSYCH  #No acute concerns  Plan:  > Monitor for signs/symptoms related to neuro/psych dysfunction     PULMONARY  #No acute concerns  - Patient initially with SOB with his chest pain that have both since resolved. Maintaining saturations on room air.  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 Reports that pain felt more like a pulled muscle (previous MI felt like an elephant on my chest.). Both resolved in transit to [place] from OSH  - EKG at OSH without ST elevations or depressions. Troponin 3.085 (not high-sensitivity)  - Admission EKG: Rate-controlled atrial fibrillation with intermittent v-pacing  - 04/2022 Echo: EF 60%; unable to assess diastolic dysfunction, mild to moderate Mark, mild TR, aortic valve sclerosis with calcification and mild MI  - 12/2021 Lipid Profile: cholesterol 116, trigs 92, HDL 29, and LDL  69  - Trop: [place] admission -> 659/814/765 for 0-/2-/4-hour; BNP was 4087.  - PTA meds: apixaban 5mg  BID, metoprolol tartrate 25mg  BID, losartan 100mg  daily, pravastatin 40mg  qHS  Plan:  > Heparin drip stopped, restarted PTA apixaban   > Left cath with PCI completed 09/10/2022.   > Trending troponin, peaked at 814 (slight rise @2hr  but mostly flat since admission).  > Daily AM BMP and Mg with plans to keep K>/=4 and Mg>/=2. (Repleted K+ today with )  > Will continue PTA metoprolol tartrate 25mg  BID.  > Most recent lipid panel 12/2021: cholesterol 116, trigs 92, HDL 29, and LDL 69. Repeat lipid panel ordered. Plan to switch to rosuvastatin 40mg   > ECHO showed reduced LVEF      FEN/GI  #GERD  - Symptoms well managed on current regimen.  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 Consideration of CKD2 or 3a should be made based on these findings.  - PTA Medications: tamsulosin 0.4mg  daily and finasteride 5mg  daily  Plan:  > Replace electrolytes PRN  > Monitor kidney function with daily BMP  > Continue PTA tamsulosin and finasteride    ENDOCRINE  #DMT2  - 12/2021 A1c 6.6%  - PTA Medications: Jardiance 25mg  daily  - No prior insulin requirements PTA  Plan:  > Holding PTA Jardiance   > POC ACHS blood sugar checks with LDCF ordered    HEME/ONC  #No acute concerns  Plan:  > Monitor Hgb with daily CBC  > Transfuse if Hgb <7 or <8 with active bleeding  > DVT Ppx: PTA Apixaban    ID  #No acute concerns  - Temp (24hrs), Avg:36.4 ?C (97.6 ?F), Min:36.3 ?C (97.4 ?F), Max:36.6 ?C (97.9 ?F)  Plan:  > Continue to monitor for signs/symptoms of infection     MSK/DERM  No concerns at this time     LDA/PROPHYLAXIS  Lines: PIV (x2)  Tubes: None  Urinary Catheter:  No  Antibiotic Usage:  No  VTE ppx: SCDs, heparin gtt  GI:  PPI  Bowel regimen: Pt having regular BMs  Diet: Cardiac diet  Code status: FULL CODE  Nutrition: No Dietician Consult  Wound: No Wound Consult    DISPO: Continue admission to [facility] pending blood pressure   Pt's primary caretaker is himself, he lives at home with his wife and is fully independent   PT/OT recommendation: pending    Discussed with Dr. Beather Arbour, MD     Dr. Virgel Bouquet  PGY-1 Internal Medicine  Available on Voalte       SUBJECTIVE:     Feels asymptomatic, denies any chest pain, shortness of breath, dizziness or build up of fluid in lungs.  Feels ready for home.  Aware of the reasons we would like him to have a temporary ICD vest. Happy to stay for another day to wait to get that done.  Aware of the importance of adherence to follow up and engagement in cardiac rehabilitation.    Review of Systems:  Full ROS completed and negative unless stated above.     OBJECTIVE:  Vital Signs:  BP 92/55 (BP Source: Arm, Left Upper)  - Pulse 74  - Temp 37.1 ?C (98.7 ?F)  - Ht 177.8 cm (5' 10)  - Wt 85.9 kg (189 lb 6 oz)  - SpO2 94%  - BMI 27.17 kg/m?     Intake/Output Summary:  (Last 24 hours)    Intake/Output Summary (Last 24 hours) at 09/12/2022 0641  Last data filed at 09/12/2022 0400  Gross per 24 hour   Intake 930 ml   Output 595 ml   Net 335 ml         Physical Exam  HENT:      Mouth/Throat:      Pharynx: Oropharynx is clear.   Eyes:      Pupils: Pupils are equal, round, and reactive to light.   Cardiovascular:      Rate and Rhythm: Normal rate. Rhythm irregular.      Pulses: Normal pulses.   Pulmonary:      Effort: Pulmonary effort is normal.   Abdominal:      General: Abdomen is flat.   Musculoskeletal:         General: Normal range of motion.      Cervical back: Normal range of motion.   Skin:     Capillary Refill: Capillary refill takes less than 2 seconds.   Neurological:      General: No focal deficit present.      Mental Status: He is alert.   Psychiatric:         Mood and Affect: Mood normal.        Labs:  Recent Labs     09/10/22  0026 09/11/22  0401 09/12/22  0400   HGB 13.8 14.3 14.4   WBC 6.4 8.2 7.9   PLTCT 194 195 195   NA 141 141 141   K 3.6 4.1 4.1   CL 109 109 105   CO2 21 21 26    BUN 15 17 18    CR 1.06 1.42* 1.45*   GLU 124* 131* 126*   CA 9.1 9.5 9.3   MG 2.0 2.0 1.9     Meds:  Scheduled Meds:apixaban (ELIQUIS) tablet 5 mg, 5 mg, Oral, BID  aspirin chewable tablet 81 mg, 81 mg, Oral, QDAY  carvediloL (COREG) tablet 3.125 mg, 3.125 mg, Oral, BID  clopiDOGreL (PLAVIX) tablet 75 mg, 75 mg, Oral, QDAY  finasteride (PROSCAR) tablet 5 mg, 5 mg, Oral, QDAY  [Held by Provider] furosemide (LASIX) tablet 40 mg, 40 mg, Oral, BID(9-17)  insulin aspart (U-100) (NOVOLOG FLEXPEN U-100 INSULIN) injection PEN 0-6 Units, 0-6 Units, Subcutaneous, ACHS (22)  losartan (COZAAR) tablet 12.5 mg, 12.5 mg, Oral, QDAY  melatonin tablet 5 mg, 5 mg, Oral, QHS  pantoprazole DR (PROTONIX) tablet 40 mg, 40 mg, Oral, QDAY(21)  rosuvastatin (CRESTOR) tablet 20 mg, 20 mg, Oral, QHS  spironolactone (CAROSPIR) suspension 12.5 mg, 12.5 mg, Oral, QDAY  tamsulosin (FLOMAX) capsule 0.4 mg, 0.4 mg, Oral, QDAY after breakfast    Continuous Infusions:    Guideline-Based Acute Myocardial Infarction Therapies    ACUTE MYOCARDIAL INFARCTION  Type: Non-STEMI:   P2y12 Antagonist:  Yes    ASA:  Yes. This will not be continued beyond discharge because no triple therapy     Beta Blocker:  Yes    ACE/ARB/ARNI: (If EF <40%)  Yes: Aldosterone blocking agent ordered if pt has diagnosis of diabetes mellitus or heart failure and EF </= 40% and beta blocker ordered: Yes  High Intensity Statin (Atorvastatin 40-80 mg or Rosuvastatin 20-40 mg):  Yes    Evaluation of Left Ventricular EF this admission:  Yes: EF 30% - Is EF < 40%: Yes    Cardiac Rehab Consult - Outpatient Cardiac Rehab:  Yes     PRN and Respiratory Meds:acetaminophen Q4H PRN, aluminum-magnesium hydroxide Q4H PRN, dextrose 50% (D50) IV PRN, diclofenac sodium QID PRN, diphenhydrAMINE HCL Q4H PRN **OR** diphenhydrAMINE HCL Q4H PRN, ondansetron (ZOFRAN) IV Q6H PRN, traZODone QHS PRN

## 2022-09-13 ENCOUNTER — Encounter: Admit: 2022-09-13 | Discharge: 2022-09-14 | Payer: MEDICARE

## 2022-09-13 ENCOUNTER — Encounter: Admit: 2022-09-13 | Discharge: 2022-09-13 | Payer: MEDICARE

## 2022-09-13 ENCOUNTER — Inpatient Hospital Stay: Admit: 2022-09-13 | Discharge: 2022-09-13 | Payer: MEDICARE

## 2022-09-13 DIAGNOSIS — I340 Nonrheumatic mitral (valve) insufficiency: Secondary | ICD-10-CM

## 2022-09-13 DIAGNOSIS — Z7901 Long term (current) use of anticoagulants: Secondary | ICD-10-CM

## 2022-09-13 DIAGNOSIS — E782 Mixed hyperlipidemia: Secondary | ICD-10-CM

## 2022-09-13 DIAGNOSIS — Z7902 Long term (current) use of antithrombotics/antiplatelets: Secondary | ICD-10-CM

## 2022-09-13 DIAGNOSIS — I4821 Permanent atrial fibrillation: Secondary | ICD-10-CM

## 2022-09-13 DIAGNOSIS — E119 Type 2 diabetes mellitus without complications: Secondary | ICD-10-CM

## 2022-09-13 DIAGNOSIS — K219 Gastro-esophageal reflux disease without esophagitis: Secondary | ICD-10-CM

## 2022-09-13 DIAGNOSIS — Z950 Presence of cardiac pacemaker: Secondary | ICD-10-CM

## 2022-09-13 DIAGNOSIS — N400 Enlarged prostate without lower urinary tract symptoms: Secondary | ICD-10-CM

## 2022-09-13 DIAGNOSIS — I2510 Atherosclerotic heart disease of native coronary artery without angina pectoris: Secondary | ICD-10-CM

## 2022-09-13 DIAGNOSIS — I110 Hypertensive heart disease with heart failure: Secondary | ICD-10-CM

## 2022-09-13 DIAGNOSIS — I4510 Unspecified right bundle-branch block: Secondary | ICD-10-CM

## 2022-09-13 DIAGNOSIS — I252 Old myocardial infarction: Secondary | ICD-10-CM

## 2022-09-13 DIAGNOSIS — Z79899 Other long term (current) drug therapy: Secondary | ICD-10-CM

## 2022-09-13 DIAGNOSIS — I5022 Chronic systolic (congestive) heart failure: Secondary | ICD-10-CM

## 2022-09-13 DIAGNOSIS — I255 Ischemic cardiomyopathy: Secondary | ICD-10-CM

## 2022-09-13 DIAGNOSIS — Z7984 Long term (current) use of oral hypoglycemic drugs: Secondary | ICD-10-CM

## 2022-09-13 DIAGNOSIS — Z87891 Personal history of nicotine dependence: Secondary | ICD-10-CM

## 2022-09-13 DIAGNOSIS — M1990 Unspecified osteoarthritis, unspecified site: Secondary | ICD-10-CM

## 2022-09-13 DIAGNOSIS — I495 Sick sinus syndrome: Secondary | ICD-10-CM

## 2022-09-13 DIAGNOSIS — I959 Hypotension, unspecified: Secondary | ICD-10-CM

## 2022-09-13 DIAGNOSIS — I214 Non-ST elevation (NSTEMI) myocardial infarction: Secondary | ICD-10-CM

## 2022-09-13 LAB — POC GLUCOSE
POC GLUCOSE: 146 mg/dL — ABNORMAL HIGH (ref 70–100)
POC GLUCOSE: 202 mg/dL — ABNORMAL HIGH (ref 70–100)
POC GLUCOSE: 226 mg/dL — ABNORMAL HIGH (ref 70–100)

## 2022-09-13 LAB — BASIC METABOLIC PANEL: SODIUM: 139 MMOL/L — ABNORMAL LOW (ref 137–147)

## 2022-09-13 LAB — PTT (APTT): PTT: 34 s — ABNORMAL LOW (ref 24.0–36.5)

## 2022-09-13 LAB — MAGNESIUM: MAGNESIUM: 1.8 mg/dL — ABNORMAL LOW (ref >60)

## 2022-09-13 LAB — CBC
RBC COUNT: 4.9 M/UL — ABNORMAL HIGH (ref 4.4–5.5)
WBC COUNT: 9 10*3/uL (ref 4.5–11.0)

## 2022-09-13 MED ORDER — SPIRONOLACTONE 25 MG PO TAB
12.5 mg | ORAL_TABLET | Freq: Every day | ORAL | 3 refills | 90.00000 days | Status: AC
Start: 2022-09-13 — End: ?

## 2022-09-13 MED ORDER — CLOPIDOGREL 75 MG PO TAB
75 mg | ORAL_TABLET | Freq: Every day | ORAL | 0 refills | 90.00000 days | Status: AC
Start: 2022-09-13 — End: ?

## 2022-09-13 MED ORDER — CLOPIDOGREL 75 MG PO TAB
75 mg | ORAL_TABLET | Freq: Every day | ORAL | 3 refills | 90.00000 days | Status: AC
Start: 2022-09-13 — End: ?
  Filled 2022-09-13: qty 14, 14d supply, fill #1

## 2022-09-13 MED ORDER — CARVEDILOL 3.125 MG PO TAB
3.125 mg | ORAL_TABLET | Freq: Two times a day (BID) | ORAL | 3 refills | 90.00000 days | Status: AC
Start: 2022-09-13 — End: ?

## 2022-09-13 MED ORDER — LOSARTAN 25 MG PO TAB
12.5 mg | ORAL_TABLET | Freq: Every day | ORAL | 0 refills | 90.00000 days | Status: AC
Start: 2022-09-13 — End: ?
  Filled 2022-09-13: qty 7, 14d supply, fill #1

## 2022-09-13 MED ORDER — LIDOCAINE HCL 2 % MM JELP
TOPICAL | 0 refills | Status: DC | PRN
Start: 2022-09-13 — End: 2022-09-13

## 2022-09-13 MED ORDER — SPIRONOLACTONE 25 MG PO TAB
12.5 mg | ORAL_TABLET | Freq: Every day | ORAL | 0 refills | 90.00000 days | Status: AC
Start: 2022-09-13 — End: ?
  Filled 2022-09-13: qty 7, 14d supply, fill #1

## 2022-09-13 MED ORDER — FINASTERIDE 5 MG PO TAB
5 mg | ORAL_TABLET | Freq: Every day | ORAL | 0 refills | Status: CN
Start: 2022-09-13 — End: ?

## 2022-09-13 MED ORDER — ALLOPURINOL 100 MG PO TAB
100 mg | ORAL_TABLET | Freq: Every day | ORAL | 0 refills | 45.00000 days | Status: AC
Start: 2022-09-13 — End: ?
  Filled 2022-09-13: qty 14, 14d supply, fill #1

## 2022-09-13 MED ORDER — FUROSEMIDE 40 MG PO TAB
40 mg | ORAL_TABLET | Freq: Two times a day (BID) | ORAL | 0 refills | Status: CN
Start: 2022-09-13 — End: ?

## 2022-09-13 MED ORDER — CARVEDILOL 3.125 MG PO TAB
3.125 mg | ORAL_TABLET | Freq: Two times a day (BID) | ORAL | 0 refills | 90.00000 days | Status: AC
Start: 2022-09-13 — End: ?
  Filled 2022-09-13: qty 28, 14d supply, fill #1

## 2022-09-13 MED ORDER — ROSUVASTATIN 20 MG PO TAB
20 mg | ORAL_TABLET | Freq: Every evening | ORAL | 0 refills | 90.00000 days | Status: AC
Start: 2022-09-13 — End: ?

## 2022-09-13 MED ORDER — ROSUVASTATIN 20 MG PO TAB
20 mg | ORAL_TABLET | Freq: Every day | ORAL | 3 refills | 90.00000 days | Status: AC
Start: 2022-09-13 — End: ?
  Filled 2022-09-13: qty 30, 30d supply, fill #1

## 2022-09-13 NOTE — Progress Notes
Cardiology Daily Progress Note      Patient's Name:  Mark Robbins MRN: [id number]   Today's Date:  09/13/2022  Admission Date: 09/08/2022  LOS: 5 days    Problem list  Principal Problem:    NSTEMI (non-ST elevated myocardial infarction) [facility])  Active Problems:    CAD (coronary artery disease)    Hypertension    GERD (gastroesophageal reflux disease)    Mixed dyslipidemia    Diabetes mellitus (HCC)    Hx of CABG    Permanent atrial fibrillation (HCC)    On apixaban therapy    Pacemaker    Moderate mitral regurgitation    HFrEF (heart failure with reduced ejection fraction) (HCC)    Ischemic cardiomyopathy      ASSESSMENT AND PLAN      Jedaiah is a 82 y.o. 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 He was transferred to [facility] for NSTEMI and evaluation in cath lab.     09/13/2022 Updates:  - Had left heart catheterization 3 days ago, received PCI to left main extending into LAD with drug eluding stent.  - New onset reduced LVEF from 65% to 30%  - Blood pressures overnight 104/72, 119/65, 114/61   - GDMT initiation has begun. Currently on carvedilol 3.125mg  BD, losartan 12.5mg , spironolactone 12.5mg .   Discussion had NOT to initiate entresto or furosemide due to lower pressures alongside normal EDP seen on ECHO.  - Awaiting temporary ICD vest. Spoke to [facility] from [place] who is going to come today to fit it   - Mr Fant is to receive cardiac rehab at [facility]  - Home today post ICD vest placement and cardiac rehab consultation   - In terms of follow up, Mr Miyoshi has a heart failure RN f/u and coronary RN f/u booked.    NEURO/PSYCH  #No acute concerns  Plan:  > Monitor for signs/symptoms related to neuro/psych dysfunction     PULMONARY  #No acute concerns  - Patient initially with SOB with his chest pain that have both since resolved. Maintaining saturations on room air.  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 Reports that pain felt more like a pulled muscle (previous MI felt like an elephant on my chest.). Both resolved in transit to [place] from OSH  - EKG at OSH without ST elevations or depressions. Troponin 3.085 (not high-sensitivity)  - Admission EKG: Rate-controlled atrial fibrillation with intermittent v-pacing  - 04/2022 Echo: EF 60%; unable to assess diastolic dysfunction, mild to moderate MR, mild TR, aortic valve sclerosis with calcification and mild MI  - 12/2021 Lipid Profile: cholesterol 116, trigs 92, HDL 29, and LDL 69  -  Trop: [place] admission -> 659/814/765 for 0-/2-/4-hour; BNP was 4087.  - PTA meds: apixaban 5mg  BID, metoprolol tartrate 25mg  BID, losartan 100mg  daily, pravastatin 40mg  qHS  Plan:  > Heparin drip stopped, restarted PTA apixaban   > Left cath with PCI completed 09/10/2022.   > Trending troponin, peaked at 814 (slight rise @2hr  but mostly flat since admission).  > Daily AM BMP and Mg with plans to keep K>/=4 and Mg>/=2. (Repleted K+ today with )  > Will continue PTA metoprolol tartrate 25mg  BID.  > Most recent lipid panel 12/2021: cholesterol 116, trigs 92, HDL 29, and LDL 69. Repeat lipid panel ordered. Plan to switch to rosuvastatin 40mg   > ECHO showed reduced LVEF from 65 --> 30%      FEN/GI  #GERD  - Symptoms well managed on current regimen.  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 Consideration of CKD2 or 3a should be made based on these findings.  - PTA Medications: tamsulosin 0.4mg  daily and finasteride 5mg  daily  Plan:  > Replace electrolytes PRN  > Monitor kidney function with daily BMP  > Continue PTA tamsulosin and finasteride    ENDOCRINE  #DMT2  - 12/2021 A1c 6.6%  - PTA Medications: Jardiance 25mg  daily  - No prior insulin requirements PTA  Plan:  > Holding PTA Jardiance   > POC ACHS blood sugar checks with LDCF ordered    HEME/ONC  #No acute concerns  Plan:  > Monitor Hgb with daily CBC  > Transfuse if Hgb <7 or <8 with active bleeding  > DVT Ppx: PTA Apixaban    ID  #No acute concerns  - Temp (24hrs), Avg:36.4 ?C (97.6 ?F), Min:36.3 ?C (97.4 ?F), Max:36.6 ?C (97.9 ?F)  Plan:  > Continue to monitor for signs/symptoms of infection     MSK/DERM  No concerns at this time     LDA/PROPHYLAXIS  Lines: PIV (x2)  Tubes: None  Urinary Catheter:  No  Antibiotic Usage:  No  VTE ppx: SCDs, heparin gtt  GI:  PPI  Bowel regimen: Pt having regular BMs  Diet: Cardiac diet  Code status: FULL CODE  Nutrition: No Dietician Consult  Wound: No Wound Consult    DISPO: Ready for discharge home    Discussed with Dr. Beather Arbour, MD     Dr. Virgel Bouquet  PGY-1 Internal Medicine  Available on Voalte   SUBJECTIVE:     Doing well today. Was up walking around yesterday - experiencing some ankle joint pain.  No headaches, chest pain, dizziness or shortness of breath  Randel Pigg to pursue cardiac rehab - this has been organized and will be in his home town.  Matt from [place] has come round and fit ICD vest.   Has got follow up organized - will cc in his usual cardiologist  Randel Pigg for home today.    Review of Systems:  Full ROS completed and negative unless stated above.     OBJECTIVE:     Vital Signs:  BP 115/62 (BP Source: Arm, Right Upper)  - Pulse 75  - Temp 36.3 ?  C (97.3 ?F)  - Ht 177.8 cm (5' 10)  - Wt 85.9 kg (189 lb 6 oz)  - SpO2 98%  - BMI 27.17 kg/m?     Intake/Output Summary:  (Last 24 hours)    Intake/Output Summary (Last 24 hours) at 09/13/2022 4782  Last data filed at 09/13/2022 0423  Gross per 24 hour   Intake 300 ml   Output 750 ml   Net -450 ml         Physical Exam  HENT:      Mouth/Throat:      Pharynx: Oropharynx is clear.   Eyes:      Pupils: Pupils are equal, round, and reactive to light.   Cardiovascular:      Rate and Rhythm: Normal rate. Rhythm irregular.      Pulses: Normal pulses.   Pulmonary:      Effort: Pulmonary effort is normal.   Abdominal:      General: Abdomen is flat.   Musculoskeletal:         General: Normal range of motion.      Cervical back: Normal range of motion.   Skin:     Capillary Refill: Capillary refill takes less than 2 seconds.   Neurological:      General: No focal deficit present.      Mental Status: He is alert.   Psychiatric:         Mood and Affect: Mood normal.        Labs:  Recent Labs     09/11/22  0401 09/12/22  0400 09/13/22  0356   HGB 14.3 14.4 14.2   WBC 8.2 7.9 9.0   PLTCT 195 195 170   NA 141 141 139   K 4.1 4.1 4.0   CL 109 105 107   CO2 21 26 22    BUN 17 18 18    CR 1.42* 1.45* 1.19   GLU 131* 126* 136*   CA 9.5 9.3 9.3   MG 2.0 1.9 1.8     Meds:  Scheduled Meds:apixaban (ELIQUIS) tablet 5 mg, 5 mg, Oral, BID  aspirin chewable tablet 81 mg, 81 mg, Oral, QDAY  carvediloL (COREG) tablet 3.125 mg, 3.125 mg, Oral, BID  clopiDOGreL (PLAVIX) tablet 75 mg, 75 mg, Oral, QDAY  finasteride (PROSCAR) tablet 5 mg, 5 mg, Oral, QDAY  furosemide (LASIX) tablet 40 mg, 40 mg, Oral, BID(9-17)  insulin aspart (U-100) (NOVOLOG FLEXPEN U-100 INSULIN) injection PEN 0-6 Units, 0-6 Units, Subcutaneous, ACHS (22)  losartan (COZAAR) tablet 12.5 mg, 12.5 mg, Oral, QDAY  melatonin tablet 5 mg, 5 mg, Oral, QHS  pantoprazole DR (PROTONIX) tablet 40 mg, 40 mg, Oral, QDAY(21)  rosuvastatin (CRESTOR) tablet 20 mg, 20 mg, Oral, QHS  spironolactone (ALDACTONE) tablet 12.5 mg, 12.5 mg, Oral, QDAY  tamsulosin (FLOMAX) capsule 0.4 mg, 0.4 mg, Oral, QDAY after breakfast    Continuous Infusions:    Guideline-Based Acute Myocardial Infarction Therapies    ACUTE MYOCARDIAL INFARCTION  Type: Non-STEMI:   P2y12 Antagonist:  Yes    ASA:  Yes. This will not be continued beyond discharge because no triple therapy     Beta Blocker:  Yes    ACE/ARB/ARNI: (If EF <40%)  Yes: Aldosterone blocking agent ordered if pt has diagnosis of diabetes mellitus or heart failure and EF </= 40% and beta blocker ordered: Yes    High Intensity Statin (Atorvastatin 40-80 mg or Rosuvastatin 20-40 mg):  Yes    Evaluation of Left Ventricular EF this  admission:  Yes: EF 30% - Is EF < 40%: Yes    Cardiac Rehab Consult - Outpatient Cardiac Rehab:  Yes     PRN and Respiratory Meds:acetaminophen Q4H PRN, aluminum-magnesium hydroxide Q4H PRN, dextrose 50% (D50) IV PRN, diclofenac sodium QID PRN, diphenhydrAMINE HCL Q4H PRN **OR** diphenhydrAMINE HCL Q4H PRN, ondansetron (ZOFRAN) IV Q6H PRN, traZODone QHS PRN

## 2022-09-13 NOTE — Patient Education
On 09/13/2022, Mark Robbins was counseled and provided with a list of his discharge medications as part of his After Visit Summary.  The patient was instructed to bring this medication list to his next doctor's appointment and to update the list with any medication changes.  Where indicated, the patient was provided with additional medication and/or disease-state information.    All patient questions were answered and patient acknowledged understanding of the medications, side effects, and other pertinent medication information.    [place], [facility]  09/13/2022

## 2022-09-14 ENCOUNTER — Encounter: Admit: 2022-09-14 | Discharge: 2022-09-14 | Payer: MEDICARE

## 2022-09-14 ENCOUNTER — Ambulatory Visit: Admit: 2022-09-14 | Discharge: 2022-09-14 | Payer: MEDICARE

## 2022-09-14 DIAGNOSIS — Z9189 Other specified personal risk factors, not elsewhere classified: Secondary | ICD-10-CM

## 2022-09-14 NOTE — Telephone Encounter
Patient Discharge Date from hospital: 09/13/22  Date Call Attempted: 09/14/22  Number of Attempts: 1  Date Call Completed:  09/14/22      Two Patient Identifier complete: Yes [x]     Next Appointment    Next follow-up appointment on  09/16/2022 at 1130 with Herby Abraham, APRN at our [facility]. 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 States he cannot walk. He requested to reschedule for next week and prefers the [place] location due to his mobility issues. Pt was offered OV 09/20/22 at 1530 with Eyvonne Left, APRN and he accepted. OV rescheduled by Isaiah Blakes.    Transportation    Does pt have transportation?  Yes [x]     No [x]    NA []        Home Health    No     Medications    Does pt have all medications? Yes  [x]     No []       START taking:  allopurinoL (ZYLOPRIM)  carvediloL (COREG)  clopiDOGreL (PLAVIX)  This medication has multiple start dates; refer to the  medication list below for more details.  rosuvastatin (CRESTOR)  spironolactone (ALDACTONE)  This medication has multiple start dates; refer to the  medication list below for more details.  CHANGE how you take:  losartan (COZAAR)  STOP taking:  celecoxib 200 mg capsule (CeleBREX)  hydroCHLOROthiazide 25 mg tablet (HYDRODIURIL)  metoprolol tartrate 25 mg tablet  pravastatin 80 mg tablet (PRAVACHOL)    Diet    200 mg cholesterol, 2 G Na. No fluid restriction ordered.     Is patient following prescribed diet and restrictions?  Yes [x]    No []      Scale/Weight    Does pt have a scale at home?  Yes [x]    No []     Did pt weight first thing this morning?  Yes []    No [x]      If yes, what was pt's first morning weight today? Pt cannot stand up or walk right now due to gout.  Education provided    Signs and Symptoms    Pt reports the following symptoms:     No BLE edema or upper abdominal bloating/chest tightness. States his SOA is much better since admission.     Pt is wearing his Life vest and has had no alarms or issues.     Pt verbalized understanding of signs and symptoms of HF and when to contact a provider or seek immediate assistance at the ER.    Was pt given zone sheet? Yes []   No [x]     Intervention(s)    Pt educated on the importance of weighing daily first thing in the morning before dressing, before eating or drinking, and after voiding using the same scale in the same location and write results down in note pad or log. Notify us for weight gains of 3 lbs in one day or 5 lbs in one week. Notify your provider for increased SOA.  Notify your provider for swelling or increased swelling in BLE or abdominal fullness/bloating. Advised to check B/P at least once daily. Check 1-2 hours after am meds. Log results. Check B/P other times if feeling lightheaded, dizzy, or if you feel your heart rate is elevated. Document the time you checked and any symptoms you may be feeling at the time. Call 911 for sudden, severe chest/pain pressure/SOA develops. Be sure to keep your follow up appointment and bring your weight  logs, B/P logs, and medication list with you to your appointment. Call us at [phone] if you have any questions.   Plan of Care    Continued education needed for heart failure symptom management and when to contact our office.

## 2022-10-14 ENCOUNTER — Encounter: Admit: 2022-10-14 | Discharge: 2022-10-14 | Payer: MEDICARE

## 2022-10-14 DIAGNOSIS — Z7901 Long term (current) use of anticoagulants: Secondary | ICD-10-CM

## 2022-10-14 DIAGNOSIS — Z951 Presence of aortocoronary bypass graft: Secondary | ICD-10-CM

## 2022-10-14 DIAGNOSIS — E782 Mixed hyperlipidemia: Secondary | ICD-10-CM

## 2022-10-14 DIAGNOSIS — I255 Ischemic cardiomyopathy: Secondary | ICD-10-CM

## 2022-10-14 DIAGNOSIS — I340 Nonrheumatic mitral (valve) insufficiency: Secondary | ICD-10-CM

## 2022-10-14 DIAGNOSIS — R0600 Dyspnea, unspecified: Secondary | ICD-10-CM

## 2022-10-14 DIAGNOSIS — I25810 Atherosclerosis of coronary artery bypass graft(s) without angina pectoris: Secondary | ICD-10-CM

## 2022-10-14 DIAGNOSIS — I4821 Permanent atrial fibrillation: Secondary | ICD-10-CM

## 2022-10-14 DIAGNOSIS — I10 Essential (primary) hypertension: Secondary | ICD-10-CM

## 2022-10-14 DIAGNOSIS — K219 Gastro-esophageal reflux disease without esophagitis: Secondary | ICD-10-CM

## 2022-10-14 DIAGNOSIS — Z950 Presence of cardiac pacemaker: Secondary | ICD-10-CM

## 2022-10-14 DIAGNOSIS — I5020 Unspecified systolic (congestive) heart failure: Secondary | ICD-10-CM

## 2022-10-14 DIAGNOSIS — E119 Type 2 diabetes mellitus without complications: Secondary | ICD-10-CM

## 2022-10-14 DIAGNOSIS — E785 Hyperlipidemia, unspecified: Secondary | ICD-10-CM

## 2022-10-14 DIAGNOSIS — E089 Diabetes mellitus due to underlying condition without complications: Secondary | ICD-10-CM

## 2022-10-14 MED ORDER — SPIRONOLACTONE 25 MG PO TAB
25 mg | ORAL_TABLET | Freq: Every day | ORAL | 3 refills | 90.00000 days | Status: AC
Start: 2022-10-14 — End: ?

## 2022-10-15 ENCOUNTER — Encounter: Admit: 2022-10-15 | Discharge: 2022-10-15 | Payer: MEDICARE

## 2022-10-15 ENCOUNTER — Ambulatory Visit: Admit: 2022-10-15 | Discharge: 2022-10-15 | Payer: MEDICARE

## 2022-10-15 DIAGNOSIS — Z9189 Other specified personal risk factors, not elsewhere classified: Secondary | ICD-10-CM

## 2022-10-18 ENCOUNTER — Encounter: Admit: 2022-10-18 | Discharge: 2022-10-18 | Payer: MEDICARE

## 2022-10-18 DIAGNOSIS — I5020 Unspecified systolic (congestive) heart failure: Secondary | ICD-10-CM

## 2022-10-18 NOTE — Telephone Encounter
-----   Message from Georges Mouse, MD sent at 10/18/2022 10:19 AM CDT -----  No other changes.  Kidney function is stable.  Iron stores are optimal.  Hemoglobin is stable.  Please let the patient know.  ----- Message -----  From: Rogelia Boga, RN  Sent: 10/18/2022   8:50 AM CDT  To: Lyndel Safe, MD    Labs for your review.  Please let me know your recommendations.  Thank you!

## 2022-10-18 NOTE — Telephone Encounter
Pt called into nurling line and states that he is having an issue with his lifevest.  He states that he spoke to the lifevest rep and they told him that the lifevest is counting double beats due to the pacemaker.  He keeps getting gongs and sirens with red lights and he has 60 seconds to hit a button on his lifevest before it shocks him.  He said the lifevest device rep told him to call and see if the parameter could be increased from 150 to 200 and that should mitigate the problem.    He is feeling okay - no issues other than the frequent sounds, lights and sirens coming from his device.    Will route to Dr. Hedda Slade for review and recommendations.

## 2022-10-18 NOTE — Telephone Encounter
Left VM with results and recommendations.  Left callback number for any questions or concerns.

## 2022-10-20 ENCOUNTER — Ambulatory Visit: Admit: 2022-10-20 | Discharge: 2022-10-20 | Payer: MEDICARE

## 2022-10-20 ENCOUNTER — Encounter: Admit: 2022-10-20 | Discharge: 2022-10-20 | Payer: MEDICARE

## 2022-10-20 DIAGNOSIS — I509 Heart failure, unspecified: Secondary | ICD-10-CM

## 2022-10-27 ENCOUNTER — Encounter: Admit: 2022-10-27 | Discharge: 2022-10-27 | Payer: MEDICARE

## 2022-10-27 NOTE — Telephone Encounter
Left message at Dr. Murlean Hark office at the [facility] requesting a referral for cardiac rehab.  Patient is planning to do cardiac rehab at [facility]. Await return call.     Dr. Thurman Coyer  phone [phone]

## 2022-11-01 ENCOUNTER — Encounter: Admit: 2022-11-01 | Discharge: 2022-11-01 | Payer: MEDICARE

## 2022-11-04 ENCOUNTER — Encounter: Admit: 2022-11-04 | Discharge: 2022-11-04 | Payer: MEDICARE

## 2022-11-04 DIAGNOSIS — Z7901 Long term (current) use of anticoagulants: Secondary | ICD-10-CM

## 2022-11-04 DIAGNOSIS — I4821 Permanent atrial fibrillation: Secondary | ICD-10-CM

## 2022-11-04 DIAGNOSIS — I10 Essential (primary) hypertension: Secondary | ICD-10-CM

## 2022-11-04 DIAGNOSIS — I25810 Atherosclerosis of coronary artery bypass graft(s) without angina pectoris: Secondary | ICD-10-CM

## 2022-11-04 DIAGNOSIS — I77810 Thoracic aortic ectasia: Secondary | ICD-10-CM

## 2022-11-04 DIAGNOSIS — K219 Gastro-esophageal reflux disease without esophagitis: Secondary | ICD-10-CM

## 2022-11-04 DIAGNOSIS — I255 Ischemic cardiomyopathy: Secondary | ICD-10-CM

## 2022-11-04 DIAGNOSIS — Z951 Presence of aortocoronary bypass graft: Secondary | ICD-10-CM

## 2022-11-04 DIAGNOSIS — R0602 Shortness of breath: Secondary | ICD-10-CM

## 2022-11-04 DIAGNOSIS — E089 Diabetes mellitus due to underlying condition without complications: Secondary | ICD-10-CM

## 2022-11-04 DIAGNOSIS — R0600 Dyspnea, unspecified: Secondary | ICD-10-CM

## 2022-11-04 DIAGNOSIS — E785 Hyperlipidemia, unspecified: Secondary | ICD-10-CM

## 2022-11-04 DIAGNOSIS — E119 Type 2 diabetes mellitus without complications: Secondary | ICD-10-CM

## 2022-11-04 DIAGNOSIS — I5020 Unspecified systolic (congestive) heart failure: Secondary | ICD-10-CM

## 2022-11-04 MED ORDER — ENTRESTO 24-26 MG PO TAB
1 | ORAL_TABLET | Freq: Two times a day (BID) | ORAL | 3 refills | Status: AC
Start: 2022-11-04 — End: ?

## 2022-11-04 NOTE — Progress Notes
Date of Service: 11/04/2022    Patient: Mark Robbins; ZOX:[id number]; DOB: 25-Nov-1940  His PCP is Erskine Emery.    Referring physician:Thomas, Alycia Rossetti, MD         Subjective:  Mark Robbins is a 82 y.o. 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 history of multiple joint replacements, including an artificial hip on the right side and an artificial knee on the left side.     He was admitted at [facility] from 8/7-8/11/24 for NSTEMI. He received a left heart catheterization on 09/10/22, and was found to have native LAD ostial 80% severely calcified, he received a PCI to the left main vessel extending into the LAD.  He was discharged on Plavix and apixaban.  After 1 year transition to aspirin and apixaban for lifelong. An ECHO was done showing new onset reduced LVEF from 65% to 30% with moderate MV insufficiency.  He was discharged on LifeVest.  Due to recent drop in LVEF from 65 to 30% he was referred to advanced heart clinic for further management.    Today, he is accompanied with his ex-wife.  She helps him out with groceries and meals.he reports feeling 'absolutely wonderful.' He is scheduled to start cardiac rehabilitation at [facility] tomorrow and has been walking daily with a walker. He experiences shortness of breath upon exertion, which resolves upon rest. He denies chest pain and leg swelling. His blood pressure has been stable, with readings at home similar to or lower than the 131/80 recorded during the visit.    The patient uses a walker for mobility and reports no shortness of breath with limited activity.  Denies any lower extremity edema.  Denies any dizziness or lightheadedness.  Denies any loss of consciousness.    Baseline functional status: Very limited.  Walks using walker.        Past Medical History:    Past Medical History:   Diagnosis Date    Diabetes mellitus (HCC) 06/26/2013    Dyspnea 02/06/2018    GERD (gastroesophageal reflux disease) 06/26/2013    Hyperlipemia 06/26/2013    Hypertension 06/26/2013          Surgical History:   Procedure Laterality Date    INSERTION/ REPLACEMENT PERMANENT PACEMAKER WITH ATRIAL AND VENTRICULAR LEAD Left 11/10/2018    Performed by Jen Mow, MD at [facility] EP LAB    ANGIOGRAPHY CORONARY ARTERY AND ARTERIAL/VENOUS GRAFTS WITH LEFT HEART CATHETERIZATION N/A 09/10/2022    Performed by Julienne Kass, MD at [facility] CATH LAB    PERCUTANEOUS CORONARY STENT PLACEMENT WITH ANGIOPLASTY N/A 09/10/2022    Performed by Julienne Kass, MD at [facility] CATH LAB        Family History:  Patient mentioned that his mother died of congestive heart failure at the age of 50 years.  No other family history.    Social History:  He lives alone and has been receiving help from a companion since his recent hospitalization.  He has 3 kids.  He is a Cytogeneticist.  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  No drinking, no smoking and no illicit drugs       Review of Systems:  General: Denies fever, chills, malaise, night sweats, significant weight change  HEENT: negative for dry/itchy eyes, congestion, sore throat  Pulm: negative for cough, hemoptysis, and shortness of breath at rest  CV: as per HPI  GI: negative for N/V, abdominal pain, blood in stools, constipation, diarrhea  GU: negative for dysuria, hematuria  MSK: Positive for multiple joint pain, uses walker for walking.  He has history of knee replacement and hip replacement.  Neuro: No dizziness or lightheadedness or loss of consciousness.  Heme/Lymph: Bruises easily.  Derm: Has some skin bruising.    Medications:    Current Outpatient Medications:     acetaminophen (TYLENOL EXTRA STRENGTH) 500 mg tablet, Take one tablet by mouth every 6 hours as needed for Pain. Max of 4,000 mg of acetaminophen in 24 hours., Disp: , Rfl:     allopurinoL (ZYLOPRIM) 100 mg tablet, Take one tablet by mouth daily. Take with food.  Indications: treatment to prevent acute gout attack, Disp: 28 tablet, Rfl: 0    apixaban (ELIQUIS) 5 mg tablet, Take one tablet by mouth twice daily., Disp: 60 tablet, Rfl: 11    carvediloL (COREG) 3.125 mg tablet, Take one tablet by mouth twice daily with meals. Take with food. 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 [facility]  Indications: blood clot prevention following percutaneous coronary intervention, Disp: 90 tablet, Rfl: 3    colchicine (COLCRYS) 0.6 mg tablet, Take one tablet by mouth as Needed., Disp: , Rfl:     cyanocobalamin (vitamin B-12) 1,000 mcg capsule, Take one capsule by mouth daily., Disp: , Rfl:     diclofenac sodium (VOLTAREN) 1 % topical gel, Apply four g topically to affected area four times daily., Disp: , Rfl:     empagliflozin (JARDIANCE) 25 mg tablet, Take one tablet by mouth daily., Disp: , Rfl:     finasteride (PROSCAR) 5 mg tablet, Take one tablet by mouth daily., Disp: , Rfl:     lidocaine (LIDODERM) 5 % topical patch, Apply one patch topically to affected area as Needed. Apply patch for 12 hours, then remove for 12 hours before repeating., Disp: , Rfl:     losartan (COZAAR) 25 mg tablet, Take one-half tablet by mouth daily. Indications: chronic heart failure, Disp: 90 tablet, Rfl: 0    Meclizine 25 mg chew, Chew one tablet by mouth as Needed., Disp: , Rfl: nitroglycerin (NITROSTAT) 0.4 mg tablet, Place one tablet under tongue. DISSOLVE ONE TABLET UNDER THE TONGUE AS DIRECTED BY PROVIDER FOR CHEST PAIN - TAKE WHILE SITTING DOWN. IF NO IMPROVEMENT AFTER FIRST DOSE CALL 9-1-1. MAY TAKE 2 ADDITIONAL DOSES, 5 MINUTES APART. - TAKE WHILE SITTING DOWN. IF NO IMPROVEMENT AFTER FIRST DOSE CALL 9-1-1. MAY TAKE 2 ADDITIONAL DOSES, 5 MINUTES APART. FOR CHEST PAIN, Disp: , Rfl:     omeprazole DR(+) (PRILOSEC) 20 mg capsule, Take one capsule by mouth daily., Disp: , Rfl:     predniSONE (STERAPRED) 5 mg tablet dose pack, Take  by mouth as Needed., Disp: ,  Rfl:     rosuvastatin (CRESTOR) 20 mg tablet, Take one tablet by mouth daily. [facility]  Indications: treatment to prevent a heart attack, Disp: 90 tablet, Rfl: 3    spironolactone (ALDACTONE) 25 mg tablet, Take one tablet by mouth daily. Take with food. [facility]  Indications: heart failure with reduced ejection fraction, Disp: 90 tablet, Rfl: 3    tamsulosin (FLOMAX) 0.4 mg capsule, Take one capsule by mouth daily., Disp: , Rfl:     Allergies:   Allergies   Allergen Reactions    Shellfish Containing Products HIVES    Duloxetine NAUSEA AND VOMITING    Levothyroxine SEE COMMENTS     Caused feeling of weakness    Lisinopril COUGH       Objective:  BP 131/88 (BP Source: Arm, Left Upper, Patient Position: Sitting)  - Pulse 78  - Ht 177.8 cm (5' 10)  - Wt 84.6 kg (186 lb 6.4 oz)  - SpO2 96%  - BMI 26.75 kg/m?   BP Readings from Last 3 Encounters:   11/04/22 131/88   10/20/22 (!) 158/73   10/14/22 135/85      Pulse Readings from Last 3 Encounters:   11/04/22 78   10/14/22 79   09/13/22 68     Wt Readings from Last 8 Encounters:   11/04/22 84.6 kg (186 lb 6.4 oz)   10/20/22 89 kg (196 lb 3.4 oz)   10/14/22 85.3 kg (188 lb)   09/13/22 87.1 kg (192 lb)   04/15/22 89.4 kg (197 lb)   04/05/22 89 kg (196 lb 3.4 oz)   07/02/21 87.2 kg (192 lb 3.2 oz)   12/03/20 89.8 kg (198 lb)     Constitutional: He appears well-developed and well-nourished.   HENT:  Head: Normocephalic.   Mouth/Throat: Oropharynx is clear and moist.   Eyes: Conjunctivae are normal.   Neck: Normal range of motion.  JVP is normal.  Chest wall: Well-healed midline incision.  Cardiovascular: Irregular rate and rhythm.  There is presence of clear systolic murmur at the mitral area.  Pulmonary/Chest: Effort normal and breath sounds normal. No respiratory distress. He has no wheezes. He has no rales. He exhibits no chest wall tenderness.   Abdominal: Soft. Bowel sounds are normal. He exhibits no distension. There is no tenderness.   Musculoskeletal: Very limited range of motion in the left lower extremity.  Unable to flex his left knee.  Neurological: He is alert and oriented to person, place, and time. No focal deficits.  Skin: Skin is warm. No erythema.   Psychiatric: He has a normal mood and affect. Judgment, behavior, and thought content normal.       Labs Reviewed:  CBC w/Diff    Lab Results   Component Value Date/Time    WBC 5.67 10/14/2022 12:00 AM    RBC 5.48 10/14/2022 12:00 AM    HGB 15.4 10/14/2022 12:00 AM    HCT 48.4 10/14/2022 12:00 AM    MCV 88.3 10/14/2022 12:00 AM    MCH 28.1 10/14/2022 12:00 AM    MCHC 31.8 (L) 10/14/2022 12:00 AM    RDW 15.9 (H) 10/14/2022 12:00 AM    PLTCT 50.2 (H) 10/14/2022 12:00 AM    MPV 9.5 10/14/2022 12:00 AM    Lab Results   Component Value Date/Time    NEUT 77 09/08/2022 10:10 PM    ANC 6.26 09/08/2022 10:10 PM    LYMA 15 (L)  09/08/2022 10:10 PM    ALC 1.17 09/08/2022 10:10 PM    MONA 6 09/08/2022 10:10 PM    AMC 0.52 09/08/2022 10:10 PM    EOSA 1 09/08/2022 10:10 PM    AEC 0.05 09/08/2022 10:10 PM    BASA 1 09/08/2022 10:10 PM    ABC 0.06 09/08/2022 10:10 PM        Comprehensive Metabolic Profile    Lab Results   Component Value Date/Time    NA 142 10/14/2022 12:00 AM    K 4.6 10/14/2022 12:00 AM    K 4.6 10/06/2022 12:00 AM    K 4.0 09/13/2022 03:56 AM    K 4.1 09/12/2022 04:00 AM    CL 108 (H) 10/14/2022 12:00 AM    CO2 21.0 (L) 10/14/2022 12:00 AM    GAP 13 10/14/2022 12:00 AM    BUN 29.4 (H) 10/14/2022 12:00 AM    CR 1.12 10/14/2022 12:00 AM    CR 1.27 10/06/2022 12:00 AM    CR 1.19 09/13/2022 03:56 AM    CR 1.45 (H) 09/12/2022 04:00 AM    GLU 134 (H) 10/14/2022 12:00 AM    GLU 126 (H) 12/08/2002 04:05 AM    Lab Results   Component Value Date/Time    CA 10.4 (H) 10/14/2022 12:00 AM    PO4 3.3 12/06/2002 03:50 AM    ALBUMIN 3.9 10/14/2022 12:00 AM    TOTPROT 7.1 10/14/2022 12:00 AM    ALKPHOS 73 10/14/2022 12:00 AM    AST 13 10/14/2022 12:00 AM    ALT 10 10/14/2022 12:00 AM    TOTBILI 1.08 10/14/2022 12:00 AM    GFR 66.4 10/14/2022 12:00 AM    GFRAA >60 11/11/2018 04:08 AM        Cardiac Enzymes Lipids   Lab Results   Component Value Date/Time    BNP 333 (H) 10/14/2022 12:00 AM    BNP 199.3 (H) 10/06/2022 12:00 AM    TNI 6.76 (H) 12/04/2002 05:00 AM    TNI 10.40 (H) 12/04/2002 12:01 AM    TNI 7.02 (H) 12/03/2002 04:27 PM    Lab Results   Component Value Date    CHOL 79 10/06/2022    TRIG 75 10/06/2022    HDL 28 (L) 10/06/2022    LDL 36 10/06/2022    VLDL 25 09/10/2022    NONHDLCHOL 61 09/10/2022         Coagulation Endocrine   Lab Results   Component Value Date/Time    INR 1.1 11/10/2018 09:26 AM    INR 1.3 12/06/2002 11:26 AM    INR 1.2 12/05/2002 03:00 AM    INR 1.4 12/03/2002 04:27 PM    PT 13.3 12/06/2002 11:26 AM    Lab Results   Component Value Date/Time    HGBA1C 7.1 (H) 10/06/2022 12:00 AM    HGBA1C 7.2 (H) 09/10/2022 12:26 AM    HGBA1C 6.6 (H) 12/17/2021 12:00 AM    TSH 6.332 (H) 10/06/2022 12:00 AM             Imaging and procedures reviewed:  Echo:   10/20/22    The left ventricular size is normal. The left ventricular wall thickness is normal. The left ventricular systolic function is moderately reduced. The visually estimated ejection fraction is 35-40%. There are segmental wall motion abnormalities, as described below. Abnormal septal motion consistent with right ventricular pacing.    The right ventricular size is normal. The right ventricular systolic function is probably normal.  Left Atrium: Severely dilated.    Mitral Valve: The mitral valve was not well seen. Non-specific thickening. No stenosis.  Poorly characterized jet of at least moderate regurgitation.    Estimated Peak Systolic PA Pressure 46 mmHg    Elevated estimated central venous pressure (5-10 mm Hg).    The ascending aorta is dilated.  (4.4 cm).  The aortic root is dilated.  (4.2 cm)    No pericardial effusion.    Compared with study dated 09/09/2022, no significant change is noted.    09/09/22:  Moderate left ventricular dilatation with moderately reduced systolic function, biplane LVEF 34%.  Unable to assess diastolic function due to underlying atrial arrhythmia.  There is severe hypokinesis/akinesis of the anterior, anterolateral, and inferolateral myocardial segments.    Severe left atrial enlargement.  Mild right atrial enlargement.  Mild mitral or calcification without stenosis.  Moderate mitral regurgitation due to dilated annulus with a centrally directed regurgitant.  Mild tricuspid regurgitation.  Sclerotic aortic valve with mild aortic insufficiency.  No aortic stenosis.  Plethoric IVC, estimated CVP ~15 mmHg.  Elevated pulmonary pressures, estimated PASP 49 mmHg (<50% SBP)  Moderate dilatation of the aortic root measuring 4.4 cm, moderate dilatation of the proximal ascending aorta measuring 4.5 cm.  No pericardial effusion.     Comparison is made with previous echocardiogram dated 04/06/2022: The previous study was technically difficult due to poor endocardial visualization.  Echo contrasting agent was not utilized on the prior study.  There has been interval increase in LV size and reduction in LV function, previously LVEF was 60%.  The limited views of the LV endocardium did not demonstrate any overt wall motion abnormalities on the prior study and the anterior and lateral areas of hypokinesis on today's study appear new. The left atrium continues to be severely dilated.  The valvular lesions appear similar.  There has been an increase in pulmonary pressures, previously estimated PASP was 29 mmHg + CVP.  On the prior study, the aortic root at the sinuses of Valsalva measured 4.4 cm and the ascending aorta measured 4.3 cm.    Stress test: 03/02/18:  SUMMARY/OPINION:   This study is abnormal with a mostly fixed but viable defect in the inferior wall.  The very prominent liver activity may lower the sensitivity of the study due to shine through artifact in the inferior wall.  No other definite perfusion defects are present global left rectal function is within normal limits.  There is mild abnormal septal motion present.     This study is compared to the patient's previous myocardial perfusion study from 2015.     The previous study was performed with thallous chloride and pharmacological stress and was interpreted to be normal.  The current exam however is most consistent with a limited scar in the inferior wall perhaps with slight residual reversible ischemia.    Cardiac cath on 09/10/22:  FINAL IMPRESSION:    Occluded SVG to the ramus intermedius, OM1, OM2, OM3 sequential graft with no significant collaterals to the native vessels from the left system or the right system.  SVG to the right PDA has tandem 50% lesions in its proximal segment, IFR negative at 0.98.  Anastomotic site has an ostial 40% stenosis, non-flow limiting in nature.  Culprit lesion ostial LAD 80% severely calcific stenosis extending into the left main:  Status post PCI with IVL 4.0, followed by IVUS-guided PCI with a 4.0 X 23 mm Xience DES, postdilated with a 5.0 NC with a minimal  stent area 13 sq mm.  Plavix plus oral anticoagulation for the next 1 year, followed by aspirin plus OAC for life.      Immunization History   Administered Date(s) Administered    COVID-19 (MODERNA), mRNA vacc, 100 mcg/0.5 mL (PF) 02/27/2019, 03/27/2019, 11/26/2019, 05/27/2020    Covid-19 Bivalent (13yr+)(MODERNA), mRNA Vacc, 50 mcg/0.5 mL (PF) 11/11/2020    Covid-19 mRNA Vaccine >=12yo (Pfizer)(Comirnaty) 11/11/2021         Assessment/Plan:  Mark Robbins is a 82 y.o. 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 His weight today is 186 pounds.  -Recent drop in LVEF to 34% in August 2024.  Details of echo mentioned above.  His echocardiogram in September 2024 shows LVEF of around 40%.  Plan  HF Therapy:    BB: Continue Coreg 3.125 mg twice daily    ACE-I/ARB: Stop the losartan and start Entresto 24-26 mg twice daily.     MRA: spironolactone 25 mg daily    SGLT2i: Continue Jardiance 25 mg daily    ICD: LVEF greater than 35% no need for ICD currently.  Advised patient to stop wearing LifeVest.   diuretic: None required.  Patient appears euvolemic currently.  > Due to recent history of NSTEMI requiring PCI to the left mid LAD in August 2024 recommend continuing Plavix 75 mg daily.  He is not on aspirin as he is on Eliquis for atrial fibrillation.  > Continue Crestor 20 mg daily.  > We will uptitrate GDMT as able.  Hopefully with the help of GDMT and revascularization his heart function will improve.  > Plan to start cardiac rehab tomorrow.  > Continue to medically manage his mitral regurgitation for now.  > Extensive counseling regarding HFrEF was done.  All the questions were answered.    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  > Continue to monitor with yearly echocardiogram.    #Type 2 diabetes mellitus: Noted that patient is on Jardiance 25 mg daily.  Hemoglobin A1c of 7.2% in August 2024.     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  Continue Eliquis 5 mg twice daily.      Return to heart failure clinic: With me in 3 months.    Thank you for allowing me to participate in the care of this patient.  Please do not hesitate to contact me should you have any questions or concerns.         Total time spent on today's office visit  was 42 minutes.  This includes face-to-face in person visit with patient as well as nonface-to-face time including review of the EMR, outside records, labs, radiologic studies, echocardiogram & other cardiovascular studies, formulation of treatment plan, after visit summary, future disposition,  and lastly on documentation.    Lyndel Safe, MD  Advanced Heart Failure and Heart Transplant Cardiologist  [facility]  [facility]  Pager: [phone]

## 2022-11-24 ENCOUNTER — Encounter: Admit: 2022-11-24 | Discharge: 2022-11-24 | Payer: MEDICARE

## 2022-11-24 DIAGNOSIS — I4821 Permanent atrial fibrillation: Secondary | ICD-10-CM

## 2023-01-03 ENCOUNTER — Encounter: Admit: 2023-01-03 | Discharge: 2023-01-03 | Payer: MEDICARE | Primary: Geriatric Medicine

## 2023-01-03 DIAGNOSIS — I5020 Unspecified systolic (congestive) heart failure: Secondary | ICD-10-CM

## 2023-01-03 DIAGNOSIS — I495 Sick sinus syndrome: Secondary | ICD-10-CM

## 2023-01-03 DIAGNOSIS — Z950 Presence of cardiac pacemaker: Secondary | ICD-10-CM

## 2023-02-17 ENCOUNTER — Ambulatory Visit: Admit: 2023-02-17 | Discharge: 2023-02-18 | Payer: PRIVATE HEALTH INSURANCE | Primary: Geriatric Medicine

## 2023-02-17 ENCOUNTER — Encounter: Admit: 2023-02-17 | Discharge: 2023-02-17 | Payer: MEDICARE | Primary: Geriatric Medicine

## 2023-02-22 ENCOUNTER — Encounter: Admit: 2023-02-22 | Discharge: 2023-02-22 | Payer: MEDICARE | Primary: Geriatric Medicine

## 2023-02-22 ENCOUNTER — Encounter: Admit: 2023-02-22 | Discharge: 2023-02-23 | Payer: MEDICARE | Primary: Geriatric Medicine

## 2023-02-22 DIAGNOSIS — Z950 Presence of cardiac pacemaker: Secondary | ICD-10-CM

## 2023-02-22 NOTE — Telephone Encounter
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  No atrial lead to confirm. EGM shows sudden onset/termination with morphology change.    Please see full report in Epic for details and follow up as needed.    Thanks- Soil scientist / Device Team

## 2023-02-22 NOTE — Telephone Encounter
Pt returned call and left voicemail that he is not having any issues or symptoms.    Will route to Dr. Hedda Slade for review and recommendations.

## 2023-02-24 ENCOUNTER — Encounter: Admit: 2023-02-24 | Discharge: 2023-02-24 | Payer: MEDICARE | Primary: Geriatric Medicine

## 2023-03-03 ENCOUNTER — Ambulatory Visit: Admit: 2023-03-03 | Discharge: 2023-03-03 | Payer: MEDICARE | Primary: Geriatric Medicine

## 2023-03-03 ENCOUNTER — Encounter: Admit: 2023-03-03 | Discharge: 2023-03-03 | Payer: MEDICARE | Primary: Geriatric Medicine

## 2023-03-03 DIAGNOSIS — Z951 Presence of aortocoronary bypass graft: Secondary | ICD-10-CM

## 2023-03-03 DIAGNOSIS — I4821 Permanent atrial fibrillation: Secondary | ICD-10-CM

## 2023-03-03 DIAGNOSIS — I255 Ischemic cardiomyopathy: Secondary | ICD-10-CM

## 2023-03-03 DIAGNOSIS — R0602 Shortness of breath: Secondary | ICD-10-CM

## 2023-03-03 DIAGNOSIS — I5020 Unspecified systolic (congestive) heart failure: Secondary | ICD-10-CM

## 2023-03-03 DIAGNOSIS — E089 Diabetes mellitus due to underlying condition without complications: Secondary | ICD-10-CM

## 2023-03-03 DIAGNOSIS — Z7901 Long term (current) use of anticoagulants: Secondary | ICD-10-CM

## 2023-03-03 DIAGNOSIS — I340 Nonrheumatic mitral (valve) insufficiency: Secondary | ICD-10-CM

## 2023-03-03 DIAGNOSIS — I10 Essential (primary) hypertension: Secondary | ICD-10-CM

## 2023-03-03 DIAGNOSIS — I25810 Atherosclerosis of coronary artery bypass graft(s) without angina pectoris: Secondary | ICD-10-CM

## 2023-03-03 DIAGNOSIS — I77810 Thoracic aortic ectasia: Secondary | ICD-10-CM

## 2023-03-03 MED ORDER — SACUBITRIL-VALSARTAN 24-26 MG PO TAB
1 | Freq: Two times a day (BID) | ORAL | 0 refills | Status: AC
Start: 2023-03-03 — End: ?

## 2023-03-03 NOTE — Progress Notes
Date of Service: 03/03/2023    Patient: Mark Robbins; CZY:[id number]; DOB: 03-05-1940  His PCP is Forrest Moron.    Referring physician:Thomas, Alycia Rossetti, MD         Subjective:  Mark Robbins is a 83 y.o. 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 history of multiple joint replacements, including an artificial hip on the right side and an artificial knee on the left side.     He was admitted at [facility] from 8/7-8/11/24 for NSTEMI. He received a left heart catheterization on 09/10/22, and was found to have native LAD ostial 80% severely calcified, he received a PCI to the left main vessel extending into the LAD.  He was discharged on Plavix and apixaban.  After 1 year transition to aspirin and apixaban for lifelong. An ECHO was done showing new onset reduced LVEF from 65% to 30% with moderate MV insufficiency.  He was discharged on LifeVest.  Due to recent drop in LVEF from 65 to 30% he was referred to advanced heart clinic for further management.    Today, he is accompanied with his wife.  He has been monitoring his blood pressure at home and reports stable readings.  His blood pressure at home is running around 110/65 to 130/70 mmHg.  He reports feeling well with no chest pain or leg swelling. He has been attending cardiac rehab and plans to continue exercises at a local [employer] after completing the rehab program.  He is compliant with his medications.    The patient uses a walker for mobility and reports no shortness of breath with limited activity.  Denies any lower extremity edema.  Denies any dizziness or lightheadedness.  Denies any loss of consciousness.    Baseline functional status: Very limited.  Walks using walker.        Past Medical History:    Past Medical History:    Diabetes mellitus (HCC)    Dyspnea    GERD (gastroesophageal reflux disease)    Hyperlipemia    Hypertension          Surgical History:   Procedure Laterality Date    INSERTION/ REPLACEMENT PERMANENT PACEMAKER WITH ATRIAL AND VENTRICULAR LEAD Left 11/10/2018    Performed by Jen Mow, MD at [facility] EP LAB    ANGIOGRAPHY CORONARY ARTERY AND ARTERIAL/VENOUS GRAFTS WITH LEFT HEART CATHETERIZATION N/A 09/10/2022    Performed by Julienne Kass, MD at [facility] CATH LAB    PERCUTANEOUS CORONARY STENT PLACEMENT WITH ANGIOPLASTY N/A 09/10/2022    Performed by Julienne Kass, MD at [facility] CATH LAB        Family History:  Patient mentioned that his mother died of congestive heart failure at the age of 72 years.  No other family history.    Social History:  He lives alone and has been receiving help from a companion since his recent hospitalization.  He has 3 kids.  He is married but separated.  His wife still helps him with medications and groceries.  He is a Cytogeneticist.  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  No drinking, no smoking and no illicit drugs       Review of Systems:  General: Denies fever, chills, malaise, night sweats, significant weight change  HEENT: negative for dry/itchy eyes, congestion, sore throat  Pulm: negative for cough, hemoptysis, and shortness of breath at rest  CV: as per HPI  GI: negative for N/V, abdominal pain, blood in stools, constipation, diarrhea  GU: negative for dysuria, hematuria  MSK: Positive for multiple joint pain, uses walker for walking.  He has history of knee replacement and hip replacement.  Neuro: No dizziness or lightheadedness or loss of consciousness.  Heme/Lymph: Bruises easily.  Derm: Has some skin bruising.    Medications:    Current Outpatient Medications:     acetaminophen (TYLENOL EXTRA STRENGTH) 500 mg tablet, Take one tablet by mouth every 6 hours as needed for Pain. Max of 4,000 mg of acetaminophen in 24 hours., Disp: , Rfl:     allopurinoL (ZYLOPRIM) 100 mg tablet, Take one tablet by mouth daily. Take with food.  Indications: treatment to prevent acute gout attack, Disp: 28 tablet, Rfl: 0    apixaban (ELIQUIS) 5 mg tablet, Take one tablet by mouth twice daily., Disp: 60 tablet, Rfl: 11    carvediloL (COREG) 6.25 mg tablet, Take one tablet by mouth twice daily with meals. Take with food., Disp: 180 tablet, Rfl: 1    CHOLEcalciferoL (vitamin D3) (VITAMIN D3) 1,000 units tablet, Take one tablet by mouth daily., Disp: , Rfl:     clopiDOGreL (PLAVIX) 75 mg tablet, Take one tablet by mouth daily. [facility]  Indications: blood clot prevention following percutaneous coronary intervention, Disp: 90 tablet, Rfl: 3    colchicine (COLCRYS) 0.6 mg tablet, Take one tablet by mouth as Needed., Disp: , Rfl:     cyanocobalamin (vitamin B-12) 1,000 mcg capsule, Take one capsule by mouth three times weekly. Monday, Wednesday, Friday, Disp: , Rfl:     diclofenac sodium (VOLTAREN) 1 % topical gel, Apply four g topically to affected area every 8 hours as needed., Disp: , Rfl:     empagliflozin (JARDIANCE) 25 mg tablet, Take one tablet by mouth daily., Disp: , Rfl:     finasteride (PROSCAR) 5 mg tablet, Take one tablet by mouth daily., Disp: , Rfl:     lidocaine (LIDODERM) 5 % topical patch, Apply one patch topically to affected area as Needed. Apply patch for 12 hours, then remove for 12 hours before repeating., Disp: , Rfl:     Meclizine 25 mg chew, Chew one tablet by mouth as Needed., Disp: , Rfl:     nitroglycerin (NITROSTAT) 0.4 mg tablet, Place one tablet under tongue. DISSOLVE ONE TABLET UNDER THE TONGUE AS DIRECTED BY PROVIDER FOR CHEST PAIN - TAKE WHILE SITTING DOWN. IF NO IMPROVEMENT AFTER FIRST DOSE CALL 9-1-1. MAY TAKE 2 ADDITIONAL DOSES, 5 MINUTES APART. - TAKE WHILE SITTING DOWN. IF NO IMPROVEMENT AFTER FIRST DOSE CALL 9-1-1. MAY TAKE 2 ADDITIONAL DOSES, 5 MINUTES APART. FOR CHEST PAIN, Disp: , Rfl:     omeprazole DR(+) (PRILOSEC) 20 mg capsule, Take one capsule by mouth daily., Disp: , Rfl:     predniSONE (STERAPRED) 5 mg tablet dose pack, Take  by mouth as Needed., Disp: , Rfl:     rosuvastatin (CRESTOR) 20 mg tablet, Take one tablet by mouth daily. [facility]  Indications: treatment to prevent a heart  attack, Disp: 90 tablet, Rfl: 3    sacubitriL-valsartan (ENTRESTO) 24-26 mg tablet, Take one tablet by mouth twice daily., Disp: , Rfl:     spironolactone (ALDACTONE) 25 mg tablet, Take one tablet by mouth daily. Take with food. [facility]  Indications: heart failure with reduced ejection fraction, Disp: 90 tablet, Rfl: 3    tamsulosin (FLOMAX) 0.4 mg capsule, Take two capsules by mouth daily., Disp: , Rfl:     Allergies:   Allergies   Allergen Reactions    Shellfish Containing Products HIVES    Duloxetine NAUSEA AND VOMITING    Levothyroxine SEE COMMENTS     Caused feeling of weakness    Lisinopril COUGH       Objective:  BP 120/89 (BP Source: Arm, Left Upper, Patient Position: Sitting)  - Pulse 61  - Ht 177.8 cm (5' 10)  - Wt 82.9 kg (182 lb 12.8 oz)  - SpO2 97%  - BMI 26.23 kg/m?   BP Readings from Last 3 Encounters:   03/03/23 120/89   11/04/22 131/88   10/20/22 (!) 158/73      Pulse Readings from Last 3 Encounters:   03/03/23 61   11/04/22 78   10/14/22 79     Wt Readings from Last 8 Encounters:   03/03/23 82.9 kg (182 lb 12.8 oz)   11/04/22 84.6 kg (186 lb 6.4 oz)   10/20/22 89 kg (196 lb 3.4 oz)   10/14/22 85.3 kg (188 lb)   09/13/22 87.1 kg (192 lb)   04/15/22 89.4 kg (197 lb)   04/05/22 89 kg (196 lb 3.4 oz)   07/02/21 87.2 kg (192 lb 3.2 oz)     Constitutional: He appears well-developed and well-nourished.   HENT:  Head: Normocephalic.   Mouth/Throat: Oropharynx is clear and moist.   Eyes: Conjunctivae are normal.   Neck: Normal range of motion.  JVP is around 6-8 mmHg  Chest wall: Well-healed midline incision.  Cardiovascular: Irregular rate and rhythm.  There is presence of clear systolic murmur at the mitral area.  Pulmonary/Chest: Effort normal and breath sounds normal. No respiratory distress. He has no wheezes. He has no rales. He exhibits no chest wall tenderness.   Abdominal: Soft. Bowel sounds are normal. He exhibits no distension. There is no tenderness.   Musculoskeletal: Very limited range of motion in the left lower extremity.  Unable to flex his left knee.  There is presence of trace  edema in the bilateral lower extremities.  Neurological: He is alert and oriented to person, place, and time. No focal deficits.  Skin: Skin is warm. No erythema.   Psychiatric: He has a normal mood and affect. Judgment, behavior, and thought content normal.       Labs Reviewed:  CBC w/Diff    Lab Results   Component Value Date/Time    WBC 7.66 02/03/2023 12:00 AM    RBC 5.98 (H) 02/03/2023 12:00 AM    HGB 16.1 02/03/2023 12:00 AM    HCT 51.0 (H) 02/03/2023 12:00 AM    MCV 85.3 02/03/2023 12:00 AM    MCH 26.9 (L) 02/03/2023 12:00 AM    MCHC 31.6 (L) 02/03/2023 12:00 AM    RDW 17.8 (H) 02/03/2023 12:00 AM    PLTCT 182 02/03/2023 12:00 AM    MPV 9.8 02/03/2023 12:00 AM    Lab Results   Component Value Date/Time    NEUT 77 09/08/2022 10:10 PM    ANC 6.26 09/08/2022  10:10 PM    LYMA 15 (L) 09/08/2022 10:10 PM    ALC 1.17 09/08/2022 10:10 PM    MONA 6 09/08/2022 10:10 PM    AMC 0.52 09/08/2022 10:10 PM    EOSA 1 09/08/2022 10:10 PM    AEC 0.05 09/08/2022 10:10 PM    BASA 1 09/08/2022 10:10 PM    ABC 0.06 09/08/2022 10:10 PM        Comprehensive Metabolic Profile    Lab Results   Component Value Date/Time    NA 143 02/03/2023 12:00 AM    K 4.2 02/03/2023 12:00 AM    K 4.6 10/14/2022 12:00 AM    K 4.6 10/06/2022 12:00 AM    K 4.0 09/13/2022 03:56 AM    CL 108 (H) 02/03/2023 12:00 AM    CO2 24 02/03/2023 12:00 AM    GAP 13 10/14/2022 12:00 AM    BUN 17 02/03/2023 12:00 AM    CR 1.30 02/03/2023 12:00 AM    CR 1.12 10/14/2022 12:00 AM    CR 1.27 10/06/2022 12:00 AM    CR 1.19 09/13/2022 03:56 AM    GLU 131 (H) 02/03/2023 12:00 AM    Lab Results   Component Value Date/Time    CA 10.4 02/03/2023 12:00 AM    PO4 3.3 12/06/2002 03:50 AM    ALBUMIN 4.4 02/03/2023 12:00 AM    TOTPROT 7.4 02/03/2023 12:00 AM    ALKPHOS 79 02/03/2023 12:00 AM    AST 12 02/03/2023 12:00 AM    ALT 6 (L) 02/03/2023 12:00 AM    TOTBILI 0.8 02/03/2023 12:00 AM    GFR 55 02/03/2023 12:00 AM    GFRAA >60 11/11/2018 04:08 AM        Cardiac Enzymes Lipids   Lab Results   Component Value Date/Time    BNP 333 (H) 10/14/2022 12:00 AM    BNP 199.3 (H) 10/06/2022 12:00 AM    TNI 6.76 (H) 12/04/2002 05:00 AM    TNI 10.40 (H) 12/04/2002 12:01 AM    TNI 7.02 (H) 12/03/2002 04:27 PM    Lab Results   Component Value Date    CHOL 99 02/03/2023    TRIG 100 02/03/2023    HDL 32 (L) 02/03/2023    LDL 47 02/03/2023    VLDL 25 09/10/2022    NONHDLCHOL 61 09/10/2022         Coagulation Endocrine   Lab Results   Component Value Date/Time    INR 1.1 11/10/2018 09:26 AM    INR 1.3 12/06/2002 11:26 AM    INR 1.2 12/05/2002 03:00 AM    PT 13.3 12/06/2002 11:26 AM    Lab Results   Component Value Date/Time    HGBA1C 7.0 (H) 02/03/2023 12:00 AM    HGBA1C 7.1 (H) 10/06/2022 12:00 AM    HGBA1C 7.2 (H) 09/10/2022 12:26 AM    TSH 4.247 02/03/2023 12:00 AM             Imaging and procedures reviewed:  Echo:   10/20/22    The left ventricular size is normal. The left ventricular wall thickness is normal. The left ventricular systolic function is moderately reduced. The visually estimated ejection fraction is 35-40%. There are segmental wall motion abnormalities, as described below. Abnormal septal motion consistent with right ventricular pacing.    The right ventricular size is normal. The right ventricular systolic function is probably normal.    Left Atrium: Severely dilated.    Mitral Valve: The mitral valve  was not well seen. Non-specific thickening. No stenosis.  Poorly characterized jet of at least moderate regurgitation.    Estimated Peak Systolic PA Pressure 46 mmHg    Elevated estimated central venous pressure (5-10 mm Hg).    The ascending aorta is dilated.  (4.4 cm).  The aortic root is dilated.  (4.2 cm)    No pericardial effusion.    Compared with study dated 09/09/2022, no significant change is noted.    09/09/22:  Moderate left ventricular dilatation with moderately reduced systolic function, biplane LVEF 34%.  Unable to assess diastolic function due to underlying atrial arrhythmia.  There is severe hypokinesis/akinesis of the anterior, anterolateral, and inferolateral myocardial segments.    Severe left atrial enlargement.  Mild right atrial enlargement.  Mild mitral or calcification without stenosis.  Moderate mitral regurgitation due to dilated annulus with a centrally directed regurgitant.  Mild tricuspid regurgitation.  Sclerotic aortic valve with mild aortic insufficiency.  No aortic stenosis.  Plethoric IVC, estimated CVP ~15 mmHg.  Elevated pulmonary pressures, estimated PASP 49 mmHg (<50% SBP)  Moderate dilatation of the aortic root measuring 4.4 cm, moderate dilatation of the proximal ascending aorta measuring 4.5 cm.  No pericardial effusion.     Comparison is made with previous echocardiogram dated 04/06/2022: The previous study was technically difficult due to poor endocardial visualization.  Echo contrasting agent was not utilized on the prior study.  There has been interval increase in LV size and reduction in LV function, previously LVEF was 60%.  The limited views of the LV endocardium did not demonstrate any overt wall motion abnormalities on the prior study and the anterior and lateral areas of hypokinesis on today's study appear new. The left atrium continues to be severely dilated.  The valvular lesions appear similar.  There has been an increase in pulmonary pressures, previously estimated PASP was 29 mmHg + CVP.  On the prior study, the aortic root at the sinuses of Valsalva measured 4.4 cm and the ascending aorta measured 4.3 cm.    Stress test: 03/02/18:  SUMMARY/OPINION:   This study is abnormal with a mostly fixed but viable defect in the inferior wall.  The very prominent liver activity may lower the sensitivity of the study due to shine through artifact in the inferior wall.  No other definite perfusion defects are present global left rectal function is within normal limits.  There is mild abnormal septal motion present.     This study is compared to the patient's previous myocardial perfusion study from 2015.     The previous study was performed with thallous chloride and pharmacological stress and was interpreted to be normal.  The current exam however is most consistent with a limited scar in the inferior wall perhaps with slight residual reversible ischemia.    Cardiac cath on 09/10/22:  FINAL IMPRESSION:    Occluded SVG to the ramus intermedius, OM1, OM2, OM3 sequential graft with no significant collaterals to the native vessels from the left system or the right system.  SVG to the right PDA has tandem 50% lesions in its proximal segment, IFR negative at 0.98.  Anastomotic site has an ostial 40% stenosis, non-flow limiting in nature.  Culprit lesion ostial LAD 80% severely calcific stenosis extending into the left main:  Status post PCI with IVL 4.0, followed by IVUS-guided PCI with a 4.0 X 23 mm Xience DES, postdilated with a 5.0 NC with a minimal stent area 13 sq mm.  Plavix plus oral anticoagulation for the  next 1 year, followed by aspirin plus OAC for life.      Immunization History   Administered Date(s) Administered    COVID-19 (MODERNA), mRNA vacc, 100 mcg/0.5 mL (PF) 02/27/2019, 03/27/2019, 11/26/2019, 05/27/2020    Covid-19 Bivalent (58yr+)(MODERNA), mRNA Vacc, 50 mcg/0.5 mL (PF) 11/11/2020    Covid-19 mRNA Vaccine >=12yo (Moderna)(Spikevax) 02/03/2023    Covid-19 mRNA Vaccine >=12yo (Pfizer)(Comirnaty) 11/11/2021         Assessment/Plan:  RAAHIL ONG is a 83 y.o. 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      # CAD status post CABG in 2004 (SVG to PDA, reverse SVG to RI, OM1, OM2, and OM3)  # Now has occluded SVG to ramus, OM1, OM 2 and OM 3 sequential graft  #NSTEMI status post PCI to left main and LAD on 09/10/22  # Ischemic cardiomyopathy  #Hyperlipidemia  #Hypertension  #Moderate mitral regurgitation  #History of NSVT  -Patient today appears to be euvolemic on examination. His weight today is 186 pounds.  -Recent drop in LVEF to 34% in August 2024.  Details of echo mentioned above.  His echocardiogram in September 2024 shows LVEF of around 40%.  Plan  HF Therapy:    BB: Continue Coreg 6.25 mg twice daily    ACE-I/ARB: Increase Entresto from half dose of 24-26 mg twice daily---> to full tablet of 24-26 mg twice daily.     MRA: spironolactone 25 mg daily    SGLT2i: Continue Jardiance 25 mg daily    ICD: LVEF greater than 35% no need for ICD currently.    diuretic: None required.  Patient appears euvolemic currently.  > Due to recent history of NSTEMI requiring PCI to the left mid LAD in August 2024 recommend continuing Plavix 75 mg daily.  He is not on aspirin as he is on Eliquis for atrial fibrillation.  > Continue Crestor 20 mg daily.  His last LDL was 47 on 02/03/23.   > We will uptitrate GDMT as able.  Hopefully with the help of GDMT and revascularization his heart function will improve.  > Continue cardiac rehab.  He has completed 28 out of 36 sessions.  > Continue to medically manage his mitral regurgitation for now.  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  > Continue to monitor with yearly echocardiogram.    #Type 2 diabetes mellitus: Noted that patient is on Jardiance 25 mg daily.  Hemoglobin A1c of 7% on 02/03/23.      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  Continue Eliquis 5 mg twice daily.      Return to heart failure clinic: With me in 3 months.    Thank you for allowing me to participate in the care of this patient.  Please do not hesitate to contact me should you  have any questions or concerns.         Total time spent on today's office visit was 42 minutes.  This includes face-to-face in person visit with patient as well as nonface-to-face time including review of the EMR, outside records, labs, radiologic studies, echocardiogram & other cardiovascular studies, formulation of treatment plan, after visit summary, future disposition,  and lastly on documentation.    Lyndel Safe, MD  Advanced Heart Failure and Heart Transplant Cardiologist  [facility]  [facility]  Pager: [phone]

## 2023-03-04 ENCOUNTER — Ambulatory Visit: Admit: 2023-03-04 | Discharge: 2023-03-05 | Payer: MEDICARE | Primary: Geriatric Medicine

## 2023-03-22 ENCOUNTER — Encounter: Admit: 2023-03-22 | Discharge: 2023-03-22 | Payer: MEDICARE | Primary: Geriatric Medicine

## 2023-03-22 MED ORDER — SACUBITRIL-VALSARTAN 49-51 MG PO TAB
1 | ORAL_TABLET | Freq: Two times a day (BID) | ORAL | 3 refills | Status: AC
Start: 2023-03-22 — End: ?

## 2023-03-29 ENCOUNTER — Encounter: Admit: 2023-03-29 | Discharge: 2023-03-29 | Payer: MEDICARE | Primary: Geriatric Medicine

## 2023-03-29 ENCOUNTER — Ambulatory Visit: Admit: 2023-03-29 | Discharge: 2023-03-30 | Payer: MEDICARE | Primary: Geriatric Medicine

## 2023-03-30 ENCOUNTER — Encounter: Admit: 2023-03-30 | Discharge: 2023-03-30 | Payer: MEDICARE | Primary: Geriatric Medicine

## 2023-05-23 ENCOUNTER — Encounter: Admit: 2023-05-23 | Discharge: 2023-05-23 | Payer: MEDICARE | Primary: Geriatric Medicine

## 2023-05-23 NOTE — Telephone Encounter
 Called and spoke to patient. Patient denies any new complaints of shortness of breath, chest pain, palpitations, etc. Patient confirmed upcoming appointment with Dr. Reida Car tomorrow in [place] office for further follow up. Urged patient to call nursing line with any worsening symptoms or report to ER. Patient verbalized understanding and has no further questions or concerns at this time.

## 2023-05-23 NOTE — Telephone Encounter
 [facility] Picker Cvm Nurse [place]  Good morning    We received a transission from this patients' device, with the following information:    * Stored EGMs are consistent with or suggestive of Atrial Fibrillation with Rapid Ventricular Response. No atrial EGM to discern. Device reported as NSVT however no onset/term. seen  * AT/AF Burden: 100%  * Total episodes: device reported 11 episodes however 8 episodes on 05/10/23 appear to be one episode with beats falling below monitor zone.  * Duration ~ 3 mins  * Avg. V rates 164-176bpm  * No EGMs available for events on 03/15/23 & 02/22/23    Please see EPIC for full report and details.    Kind regards    [facility]  Cardiac Implant Device Specialist

## 2023-05-24 ENCOUNTER — Encounter: Admit: 2023-05-24 | Discharge: 2023-05-24 | Payer: MEDICARE | Primary: Geriatric Medicine

## 2023-05-24 ENCOUNTER — Ambulatory Visit: Admit: 2023-05-24 | Discharge: 2023-05-25 | Payer: PRIVATE HEALTH INSURANCE | Primary: Geriatric Medicine

## 2023-05-24 DIAGNOSIS — I495 Sick sinus syndrome: Secondary | ICD-10-CM

## 2023-05-24 DIAGNOSIS — E782 Mixed hyperlipidemia: Secondary | ICD-10-CM

## 2023-05-24 DIAGNOSIS — I10 Essential (primary) hypertension: Secondary | ICD-10-CM

## 2023-05-24 DIAGNOSIS — I25810 Atherosclerosis of coronary artery bypass graft(s) without angina pectoris: Secondary | ICD-10-CM

## 2023-05-24 DIAGNOSIS — I5020 Unspecified systolic (congestive) heart failure: Secondary | ICD-10-CM

## 2023-05-24 DIAGNOSIS — Z951 Presence of aortocoronary bypass graft: Secondary | ICD-10-CM

## 2023-05-24 DIAGNOSIS — I340 Nonrheumatic mitral (valve) insufficiency: Secondary | ICD-10-CM

## 2023-05-24 DIAGNOSIS — E089 Diabetes mellitus due to underlying condition without complications: Secondary | ICD-10-CM

## 2023-05-24 DIAGNOSIS — I4821 Permanent atrial fibrillation: Secondary | ICD-10-CM

## 2023-05-24 DIAGNOSIS — I255 Ischemic cardiomyopathy: Secondary | ICD-10-CM

## 2023-05-24 DIAGNOSIS — I4510 Unspecified right bundle-branch block: Secondary | ICD-10-CM

## 2023-05-24 DIAGNOSIS — Z7901 Long term (current) use of anticoagulants: Secondary | ICD-10-CM

## 2023-05-24 MED ORDER — CARVEDILOL 6.25 MG PO TAB
9.375 mg | ORAL_TABLET | Freq: Two times a day (BID) | ORAL | 3 refills | 90.00000 days | Status: AC
Start: 2023-05-24 — End: ?

## 2023-05-24 MED ORDER — CELECOXIB 100 MG PO CAP
100 mg | ORAL_CAPSULE | Freq: Two times a day (BID) | ORAL | 3 refills | Status: AC | PRN
Start: 2023-05-24 — End: ?

## 2023-05-24 NOTE — Progress Notes
 Date of Service: 05/24/2023    Patient: Mark Robbins; YNW:[id number]; DOB: 10/20/1940  His PCP is Ileen Mallet.    Referring physician:Thomas, Verdie Gladden, MD         Subjective:  Mark Robbins is a 83 y.o. 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 history of multiple joint replacements, including an artificial hip on the right side and an artificial knee on the left side.     He was admitted at [facility] from 8/7-8/11/24 for NSTEMI. He received a left heart catheterization on 09/10/22, and was found to have native LAD ostial 80% severely calcified, he received a PCI to the left main vessel extending into the LAD.  He was discharged on Plavix and apixaban.  After 1 year transition to aspirin and apixaban for lifelong. An ECHO was done showing new onset reduced LVEF from 65% to 30% with moderate MV insufficiency.  He was discharged on LifeVest.  Due to recent drop in LVEF from 65 to 30% he was referred to advanced heart clinic for further management.    Today, he is accompanied with his wife.    He is compliant with his medications.  His biggest complaint is joint pains affecting his quality of life and mobility.  He also has significant joint pains and is asking about using celecoxib.  The patient reports a recent fall at a local store, which is concerning due to his use of Eliquis, a blood thinner.  The patient's diabetes is reportedly well-managed, and he is not currently taking any water pills for his heart failure. The patient is resistant to further procedures     The patient uses a walker for mobility and reports no shortness of breath with limited activity.  Denies any lower extremity edema.  Denies any dizziness or lightheadedness.  Denies any loss of consciousness.     Baseline functional status: Very limited.  Walks using walker.        Past Medical History:    Past Medical History:    Diabetes mellitus (CMS-HCC)    Dyspnea    GERD (gastroesophageal reflux disease)    Hyperlipemia    Hypertension          Surgical History:   Procedure Laterality Date    INSERTION/ REPLACEMENT PERMANENT PACEMAKER WITH ATRIAL AND VENTRICULAR LEAD Left 11/10/2018    Performed by Vaughn Georges, MD at [facility] EP LAB    ANGIOGRAPHY CORONARY ARTERY AND ARTERIAL/VENOUS GRAFTS WITH LEFT HEART CATHETERIZATION N/A 09/10/2022    Performed by Levester Reagin, MD at [facility] CATH LAB    PERCUTANEOUS CORONARY STENT PLACEMENT WITH ANGIOPLASTY N/A 09/10/2022    Performed by Levester Reagin, MD at [facility] CATH LAB        Family History:  Patient mentioned that his mother died of congestive heart failure at the age of 69 years.  No other family history.    Social History:  He lives alone and has been receiving help from a companion since his recent hospitalization.  He has 3 kids.  He is married but separated.  His wife still helps him with  medications and groceries.  He is a Cytogeneticist.  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  No drinking, no smoking and no illicit drugs       Review of Systems:  General: Denies fever, chills, malaise, night sweats, significant weight change  HEENT: negative for dry/itchy eyes, congestion, sore throat  Pulm: negative for cough, hemoptysis, and shortness of breath at rest  CV: as per HPI  GI: negative for N/V, abdominal pain, blood in stools, constipation, diarrhea  GU: negative for dysuria, hematuria  MSK: Positive for multiple joint pain, uses walker for walking.  He has history of knee replacement and hip replacement.  Neuro: No dizziness or lightheadedness or loss of consciousness.  Heme/Lymph: Bruises easily.  Derm: Has some skin bruising.    Medications:    Current Outpatient Medications:     acetaminophen (TYLENOL EXTRA STRENGTH) 500 mg tablet, Take one tablet by mouth every 6 hours as needed for Pain. Max of 4,000 mg of acetaminophen in 24 hours., Disp: , Rfl:     allopurinoL (ZYLOPRIM) 100 mg tablet, Take one tablet by mouth daily. Take with food.  Indications: treatment to prevent acute gout attack, Disp: 28 tablet, Rfl: 0    amoxicillin (AMOXIL) 500 mg capsule, Take 4 capsules by mouth one hour prior to dental appointment or procedure AS DIRECTED, Disp: , Rfl:     apixaban (ELIQUIS) 5 mg tablet, Take one tablet by mouth twice daily., Disp: 60 tablet, Rfl: 11    carvediloL (COREG) 6.25 mg tablet, Take one tablet by mouth twice daily with meals. Take with food., Disp: 180 tablet, Rfl: 1    CHOLEcalciferoL (vitamin D3) (VITAMIN D3) 50 mcg (2,000 unit) tablet, Take one tablet by mouth daily., Disp: , Rfl:     clopiDOGreL (PLAVIX) 75 mg tablet, Take one tablet by mouth daily. [facility]  Indications: blood clot prevention following percutaneous coronary intervention, Disp: 90 tablet, Rfl: 3    colchicine (COLCRYS) 0.6 mg tablet, Take one tablet by mouth as Needed., Disp: , Rfl:     cyanocobalamin (vitamin B-12) 1,000 mcg capsule, Take one capsule by mouth three times weekly. Monday, Wednesday, Friday, Disp: , Rfl:     diclofenac sodium (VOLTAREN) 1 % topical gel, Apply four g topically to affected area every 8 hours as needed., Disp: , Rfl:     empagliflozin (JARDIANCE) 25 mg tablet, Take one tablet by mouth daily., Disp: , Rfl:     finasteride (PROSCAR) 5 mg tablet, Take one tablet by mouth daily., Disp: , Rfl:     lidocaine (LIDODERM) 5 % topical patch, Apply one patch topically to affected area as Needed. Apply patch for 12 hours, then remove for 12 hours before repeating., Disp: , Rfl:     Meclizine 25 mg chew, Chew one tablet by mouth as Needed., Disp: , Rfl:     nitroglycerin (NITROSTAT) 0.4 mg tablet, Place one tablet under tongue. DISSOLVE ONE TABLET UNDER THE TONGUE AS DIRECTED BY PROVIDER FOR CHEST PAIN - TAKE WHILE SITTING DOWN. IF NO IMPROVEMENT AFTER FIRST DOSE CALL 9-1-1. MAY TAKE 2 ADDITIONAL DOSES, 5 MINUTES APART. - TAKE WHILE SITTING DOWN. IF NO IMPROVEMENT AFTER FIRST DOSE CALL 9-1-1. MAY TAKE 2 ADDITIONAL DOSES, 5 MINUTES APART. FOR CHEST PAIN, Disp: , Rfl:     omeprazole DR(+) (PRILOSEC) 20 mg capsule, Take one capsule by mouth daily., Disp: ,  Rfl:     predniSONE (STERAPRED) 5 mg tablet dose pack, Take  by mouth as Needed., Disp: , Rfl:     rosuvastatin (CRESTOR) 20 mg tablet, Take one tablet by mouth daily. [facility]  Indications: treatment to prevent a heart attack, Disp: 90 tablet, Rfl: 3    sacubitriL-valsartan (ENTRESTO) 49-51 mg tablet, Take one tablet by mouth twice daily., Disp: 180 tablet, Rfl: 3    spironolactone (ALDACTONE) 25 mg tablet, Take one tablet by mouth daily. Take with food. [facility]  Indications: heart failure with reduced ejection fraction, Disp: 90 tablet, Rfl: 3    tamsulosin (FLOMAX) 0.4 mg capsule, Take two capsules by mouth daily., Disp: , Rfl:     Allergies:   Allergies   Allergen Reactions    Shellfish Containing Products HIVES    Duloxetine NAUSEA AND VOMITING    Levothyroxine SEE COMMENTS     Caused feeling of weakness    Lisinopril COUGH       Objective:  BP 112/68 (BP Source: Arm, Left Upper, Patient Position: Sitting)  - Pulse 59  - Ht 177.8 cm (5' 10)  - Wt 84.2 kg (185 lb 9.6 oz)  - SpO2 96%  - BMI 26.63 kg/m?   BP Readings from Last 3 Encounters:   05/24/23 112/68   03/03/23 120/89   11/04/22 131/88      Pulse Readings from Last 3 Encounters:   05/24/23 59   03/03/23 61   11/04/22 78     Wt Readings from Last 8 Encounters:   05/24/23 84.2 kg (185 lb 9.6 oz)   03/29/23 89 kg (196 lb 3.4 oz)   03/03/23 82.9 kg (182 lb 12.8 oz)   11/04/22 84.6 kg (186 lb 6.4 oz)   10/20/22 89 kg (196 lb 3.4 oz)   10/14/22 85.3 kg (188 lb)   09/13/22 87.1 kg (192 lb)   04/15/22 89.4 kg (197 lb)     Constitutional: He appears well-developed and well-nourished.   HENT:  Head: Normocephalic.   Mouth/Throat: Oropharynx is clear and moist.   Eyes: Conjunctivae are normal.   Neck: Normal range of motion.  JVP is normal today.  Chest wall: Well-healed midline incision.  Cardiovascular: Irregular rate and rhythm.  There is presence of clear systolic murmur at the mitral area.  Pulmonary/Chest: Effort normal and breath sounds normal. No respiratory distress. He has no wheezes. He has no rales. He exhibits no chest wall tenderness.   Abdominal: Soft. Bowel sounds are normal. He exhibits no distension. There is no tenderness.   Musculoskeletal: Very limited range of motion in the left lower extremity.  Unable to flex his left knee.  There is presence of trace  edema in the bilateral lower extremities.  Neurological: He is alert and oriented to person, place, and time. No focal deficits.  Skin: Skin is warm. No erythema.   Psychiatric: He has a normal mood and affect. Judgment, behavior, and thought content normal.       Labs Reviewed:  CBC w/Diff    Lab Results   Component Value Date/Time    WBC 7.66 02/03/2023 12:00 AM    RBC 5.98 (H) 02/03/2023 12:00 AM    HGB 16.1 02/03/2023 12:00 AM    HCT 51.0 (H) 02/03/2023 12:00 AM    MCV 85.3 02/03/2023 12:00 AM    MCH 26.9 (L) 02/03/2023 12:00 AM    MCHC 31.6 (L) 02/03/2023 12:00 AM  RDW 17.8 (H) 02/03/2023 12:00 AM    PLTCT 182 02/03/2023 12:00 AM    MPV 9.8 02/03/2023 12:00 AM    Lab Results   Component Value Date/Time    NEUT 77 09/08/2022 10:10 PM    ANC 6.26 09/08/2022 10:10 PM    LYMA 15 (L) 09/08/2022 10:10 PM    ALC 1.17 09/08/2022 10:10 PM    MONA 6 09/08/2022 10:10 PM    AMC 0.52 09/08/2022 10:10 PM    EOSA 1 09/08/2022 10:10 PM    AEC 0.05 09/08/2022 10:10 PM    BASA 1 09/08/2022 10:10 PM    ABC 0.06 09/08/2022 10:10 PM        Comprehensive Metabolic Profile Lab Results   Component Value Date/Time    NA 143 02/03/2023 12:00 AM    K 4.2 02/03/2023 12:00 AM    K 4.6 10/14/2022 12:00 AM    K 4.6 10/06/2022 12:00 AM    K 4.0 09/13/2022 03:56 AM    CL 108 (H) 02/03/2023 12:00 AM    CO2 24 02/03/2023 12:00 AM    GAP 13 10/14/2022 12:00 AM    BUN 17 02/03/2023 12:00 AM    CR 1.30 02/03/2023 12:00 AM    CR 1.12 10/14/2022 12:00 AM    CR 1.27 10/06/2022 12:00 AM    CR 1.19 09/13/2022 03:56 AM    GLU 131 (H) 02/03/2023 12:00 AM    Lab Results   Component Value Date/Time    CA 10.4 02/03/2023 12:00 AM    PO4 3.3 12/06/2002 03:50 AM    ALBUMIN 4.4 02/03/2023 12:00 AM    TOTPROT 7.4 02/03/2023 12:00 AM    ALKPHOS 79 02/03/2023 12:00 AM    AST 12 02/03/2023 12:00 AM    ALT 6 (L) 02/03/2023 12:00 AM    TOTBILI 0.8 02/03/2023 12:00 AM    GFR 55 02/03/2023 12:00 AM    GFRAA >60 11/11/2018 04:08 AM        Cardiac Enzymes Lipids   Lab Results   Component Value Date/Time    BNP 333 (H) 10/14/2022 12:00 AM    BNP 199.3 (H) 10/06/2022 12:00 AM    TNI 6.76 (H) 12/04/2002 05:00 AM    TNI 10.40 (H) 12/04/2002 12:01 AM    TNI 7.02 (H) 12/03/2002 04:27 PM    Lab Results   Component Value Date    CHOL 99 02/03/2023    TRIG 100 02/03/2023    HDL 32 (L) 02/03/2023    LDL 47 02/03/2023    VLDL 25 09/10/2022    NONHDLCHOL 61 09/10/2022         Coagulation Endocrine   Lab Results   Component Value Date/Time    INR 1.1 11/10/2018 09:26 AM    INR 1.3 12/06/2002 11:26 AM    INR 1.2 12/05/2002 03:00 AM    PT 13.3 12/06/2002 11:26 AM    Lab Results   Component Value Date/Time    HGBA1C 7.0 (H) 02/03/2023 12:00 AM    HGBA1C 7.1 (H) 10/06/2022 12:00 AM    HGBA1C 7.2 (H) 09/10/2022 12:26 AM    TSH 4.247 02/03/2023 12:00 AM             Imaging and procedures reviewed:  Echo:   03/29/23:  Moderate LV systolic dysfunction EF estimated 40%.  LVIDD 4.7 cm.  LV diastolic volume index 74 mL/m?Mark Robbins  Normal LV dimensions for cavity size.  Wall motion abnormalities localized to the basal to mid anterolateral/inferolateral/inferior  myocardial territories.  Relative myocardial thinning suggestive of prior infarct in a left circumflex distribution/OM.  The RV is mildly dilated.  Likely mild RV systolic dysfunction.  RV pacer wire identified.  Mild-moderate MR.  No moderate TR.  Mild transvalvular AI.  CVP 3 mmHg.  PA systolic pressure estimate 26 mmHg.     Compared to a 2024 study.  Continue demonstration of moderate LV dysfunction EF estimated 40%.  The RV does appear to currently be mildly dilated.  Some views suggestive of mild RV systolic dysfunction.  Redemonstration the patient's wall motion abnormalities involving the anterolateral/inferolateral/inferior territories.    10/20/22    The left ventricular size is normal. The left ventricular wall thickness is normal. The left ventricular systolic function is moderately reduced. The visually estimated ejection fraction is 35-40%. There are segmental wall motion abnormalities, as described below. Abnormal septal motion consistent with right ventricular pacing.    The right ventricular size is normal. The right ventricular systolic function is probably normal.    Left Atrium: Severely dilated.    Mitral Valve: The mitral valve was not well seen. Non-specific thickening. No stenosis.  Poorly characterized jet of at least moderate regurgitation.    Estimated Peak Systolic PA Pressure 46 mmHg    Elevated estimated central venous pressure (5-10 mm Hg).    The ascending aorta is dilated.  (4.4 cm).  The aortic root is dilated.  (4.2 cm)    No pericardial effusion.    Compared with study dated 09/09/2022, no significant change is noted.    09/09/22:  Moderate left ventricular dilatation with moderately reduced systolic function, biplane LVEF 34%.  Unable to assess diastolic function due to underlying atrial arrhythmia.  There is severe hypokinesis/akinesis of the anterior, anterolateral, and inferolateral myocardial segments.    Severe left atrial enlargement.  Mild right atrial enlargement.  Mild mitral or calcification without stenosis.  Moderate mitral regurgitation due to dilated annulus with a centrally directed regurgitant.  Mild tricuspid regurgitation.  Sclerotic aortic valve with mild aortic insufficiency.  No aortic stenosis.  Plethoric IVC, estimated CVP ~15 mmHg.  Elevated pulmonary pressures, estimated PASP 49 mmHg (<50% SBP)  Moderate dilatation of the aortic root measuring 4.4 cm, moderate dilatation of the proximal ascending aorta measuring 4.5 cm.  No pericardial effusion.     Comparison is made with previous echocardiogram dated 04/06/2022: The previous study was technically difficult due to poor endocardial visualization.  Echo contrasting agent was not utilized on the prior study.  There has been interval increase in LV size and reduction in LV function, previously LVEF was 60%.  The limited views of the LV endocardium did not demonstrate any overt wall motion abnormalities on the prior study and the anterior and lateral areas of hypokinesis on today's study appear new. The left atrium continues to be severely dilated.  The valvular lesions appear similar.  There has been an increase in pulmonary pressures, previously estimated PASP was 29 mmHg + CVP.  On the prior study, the aortic root at the sinuses of Valsalva measured 4.4 cm and the ascending aorta measured 4.3 cm.    Stress test: 03/02/18:  SUMMARY/OPINION:   This study is abnormal with a mostly fixed but viable defect in the inferior wall.  The very prominent liver activity may lower the sensitivity of the study due to shine through artifact in the inferior wall.  No other definite perfusion defects are present global left rectal function is within normal limits.  There is mild  abnormal septal motion present.     This study is compared to the patient's previous myocardial perfusion study from 2015.     The previous study was performed with thallous chloride and pharmacological stress and was interpreted to be normal.  The current exam however is most consistent with a limited scar in the inferior wall perhaps with slight residual reversible ischemia.    Cardiac cath on 09/10/22:  FINAL IMPRESSION:    Occluded SVG to the ramus intermedius, OM1, OM2, OM3 sequential graft with no significant collaterals to the native vessels from the left system or the right system.  SVG to the right PDA has tandem 50% lesions in its proximal segment, IFR negative at 0.98.  Anastomotic site has an ostial 40% stenosis, non-flow limiting in nature.  Culprit lesion ostial LAD 80% severely calcific stenosis extending into the left main:  Status post PCI with IVL 4.0, followed by IVUS-guided PCI with a 4.0 X 23 mm Xience DES, postdilated with a 5.0 NC with a minimal stent area 13 sq mm.  Plavix plus oral anticoagulation for the next 1 year, followed by aspirin plus OAC for life.      Immunization History   Administered Date(s) Administered    COVID-19 (MODERNA), mRNA vacc, 100 mcg/0.5 mL (PF) 02/27/2019, 03/27/2019, 11/26/2019, 05/27/2020    Covid-19 Bivalent (46yr+)(MODERNA), mRNA Vacc, 50 mcg/0.5 mL (PF) 11/11/2020    Covid-19 mRNA Vaccine >=12yo (Moderna)(Spikevax) 02/03/2023    Covid-19 mRNA Vaccine >=12yo (Pfizer)(Comirnaty) 11/11/2021         Assessment/Plan:  Mark Robbins is a 83 y.o. 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      # CAD status post CABG in 2004 (SVG to PDA, reverse SVG to RI, OM1, OM2, and OM3)  # Now has occluded SVG to ramus, OM1, OM 2 and OM 3 sequential graft  #NSTEMI status post PCI to left main and LAD on 09/10/22  # Ischemic cardiomyopathy  #Hyperlipidemia  #Hypertension  #Moderate mitral regurgitation  #History of NSVT  -Patient today appears to be euvolemic on examination. His weight today is 185 pounds.  -LVEF 34% in August 2024.  Details of echo mentioned above.  His echocardiogram in September 2024 shows LVEF of around 40%.  -Now LVEF in February 2025 of around 40%.  Plan  HF Therapy:    BB: Coreg 6.25 mg twice daily--> 9.375 mg twice daily    ACE-I/ARB: continue Entresto 49-51 mg twice daily.     MRA: spironolactone 25 mg daily    SGLT2i: Continue Jardiance 25 mg daily    ICD: LVEF greater than 35% no need for ICD currently.    diuretic: None required.  Patient appears euvolemic currently.  > Due to recent history of NSTEMI requiring PCI to the left mid LAD in August 2024 recommend continuing Plavix 75 mg daily.  He is not on aspirin as he is on Eliquis for atrial fibrillation.  > Continue Crestor 20 mg daily.  His last LDL was 47 on 02/03/23.   > We will continue to uptitrate GDMT as able.  > He status post  cardiac rehab.  > Continue to medically manage his mitral regurgitation for now.  > Due to his atrial fibrillation, increased wall thickness including interventricular septal thickness of 1.9 cm and posterior wall thickness of 1.8 cm, heart failure we will obtain technetium HDP scan, serum and urine immunofixation, serum free light chains to rule out amyloidosis.    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  Echocardiogram in February 2025 shows ascending aorta of 4.7 cm.  > Continue to monitor with yearly echocardiogram.    #Type 2 diabetes mellitus: Noted that patient is on Jardiance 25 mg daily.  Hemoglobin A1c of 7% on 02/03/23.      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  Continue Eliquis  5 mg twice daily.  I discussed with him regarding Watchman device implantation.  Patient is not interested in more procedures.  Will continue Eliquis .  He did mention that if he has more falls than he will consider watchman in the future may be.    #History of right hip replacement, left knee replacement  Chronic pain  - I will prescribe him Celebrex  100 mg twice daily as needed.  We will refer patient to pain management clinic.  Advised patient to not take Celebrex  every day though.  There is a small increase of risk of bleeding given he is on Eliquis  and Plavix .    Return to heart failure clinic: With me in 6 months.    Thank you for allowing me to participate in the care of this patient.  Please do not hesitate to contact me should you have any questions or concerns.         Total time spent on today's office visit was 42 minutes.  This includes face-to-face in person visit with patient as well as nonface-to-face time including review of the EMR, outside records, labs, radiologic studies, echocardiogram & other cardiovascular studies, formulation of treatment plan, after visit summary, future disposition,  and lastly on documentation.    Radford Buffy, MD  Advanced Heart Failure and Heart Transplant Cardiologist  [facility]  [facility]  Pager: [phone]

## 2023-06-01 ENCOUNTER — Ambulatory Visit: Admit: 2023-06-01 | Discharge: 2023-06-01 | Payer: MEDICARE | Primary: Geriatric Medicine

## 2023-06-01 ENCOUNTER — Encounter: Admit: 2023-06-01 | Discharge: 2023-06-01 | Payer: MEDICARE | Primary: Geriatric Medicine

## 2023-06-02 ENCOUNTER — Encounter: Admit: 2023-06-02 | Discharge: 2023-06-02 | Payer: MEDICARE | Primary: Geriatric Medicine

## 2023-08-25 ENCOUNTER — Encounter: Admit: 2023-08-25 | Discharge: 2023-08-26 | Payer: MEDICARE | Primary: Geriatric Medicine

## 2023-11-17 ENCOUNTER — Encounter: Admit: 2023-11-17 | Discharge: 2023-11-17 | Payer: MEDICARE

## 2023-11-17 DIAGNOSIS — Z950 Presence of cardiac pacemaker: Principal | ICD-10-CM

## 2023-11-17 DIAGNOSIS — I4510 Unspecified right bundle-branch block: Secondary | ICD-10-CM

## 2023-11-17 DIAGNOSIS — R001 Bradycardia, unspecified: Secondary | ICD-10-CM

## 2023-11-17 DIAGNOSIS — I25810 Atherosclerosis of coronary artery bypass graft(s) without angina pectoris: Secondary | ICD-10-CM

## 2023-11-24 ENCOUNTER — Encounter: Admit: 2023-11-24 | Discharge: 2023-11-24 | Payer: MEDICARE

## 2023-12-05 ENCOUNTER — Encounter: Admit: 2023-12-05 | Discharge: 2023-12-05 | Payer: MEDICARE

## 2023-12-08 ENCOUNTER — Encounter: Admit: 2023-12-08 | Discharge: 2023-12-08 | Payer: MEDICARE

## 2023-12-08 VITALS — BP 156/92 | HR 62 | Ht 70.0 in | Wt 183.6 lb

## 2023-12-08 DIAGNOSIS — I5020 Unspecified systolic (congestive) heart failure: Secondary | ICD-10-CM

## 2023-12-08 DIAGNOSIS — I10 Essential (primary) hypertension: Secondary | ICD-10-CM

## 2023-12-08 DIAGNOSIS — I4510 Unspecified right bundle-branch block: Secondary | ICD-10-CM

## 2023-12-08 DIAGNOSIS — I340 Nonrheumatic mitral (valve) insufficiency: Secondary | ICD-10-CM

## 2023-12-08 DIAGNOSIS — I495 Sick sinus syndrome: Principal | ICD-10-CM

## 2023-12-08 DIAGNOSIS — I255 Ischemic cardiomyopathy: Secondary | ICD-10-CM

## 2023-12-08 MED ORDER — CELECOXIB 100 MG PO CAP
100 mg | ORAL_CAPSULE | Freq: Two times a day (BID) | ORAL | 3 refills | 30.00000 days | Status: AC | PRN
Start: 2023-12-08 — End: ?

## 2023-12-08 NOTE — Progress Notes [1]
 Date of Service: 12/08/2023    Patient: Mark Robbins; FMW:[id number]; DOB: [birth year]  His PCP is Mohammed Benders, Lonell BROCKS..    Referring physician:Thomas, Bernardino, MD         Subjective:  Mark Robbins is a 83 y.o. 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 history of multiple joint replacements, including an artificial hip on the right side and an artificial knee on the left side.     He was admitted at [facility] from 8/7-8/11/24 for NSTEMI. He received a left heart catheterization on 09/10/22, and was found to have native LAD ostial 80% severely calcified, he received a PCI to the left main vessel extending into the LAD.  He was discharged on Plavix  and apixaban .  After 1 year transition to aspirin  and apixaban  for lifelong. An ECHO was done showing new onset reduced LVEF from 65% to 30% with moderate MV insufficiency.  He was discharged on LifeVest.  Due to recent drop in LVEF from 65 to 30% he was referred to advanced heart clinic for further management.    Today, he is accompanied with his wife.   He is compliant with his medications.   The patient uses a walker for mobility and reports no shortness of breath with limited activity.  Denies any lower extremity edema.  Denies any dizziness or lightheadedness.  Denies any loss of consciousness.   He gets shortness of breath sometimes with exertion.  He takes Celebrex  daily, which he believes has improved his condition, despite previous concerns about its use.   He is on Eliquis  and Plavix  but denies any bleeding issues.  He denies any recent falls.    Patient mentioned that his home blood pressure was 117 over 78 mmHg.  His weight at home was 180 pounds.    Baseline functional status: Very limited.  Walks using walker.        Past Medical History:    Past Medical History:    Diabetes mellitus (CMS-HCC)    Dyspnea    GERD (gastroesophageal reflux disease)    Hyperlipemia    Hypertension          Surgical History:   Procedure Laterality Date    INSERTION/ REPLACEMENT PERMANENT PACEMAKER WITH ATRIAL AND VENTRICULAR LEAD Left 11/10/2018    Performed by Liborio Countryman, MD at [facility] EP LAB    ANGIOGRAPHY CORONARY ARTERY AND ARTERIAL/VENOUS GRAFTS WITH LEFT HEART CATHETERIZATION N/A 09/10/2022    Performed by Vertie Amelie BROCKS, MD at [facility] CATH LAB    PERCUTANEOUS CORONARY STENT PLACEMENT WITH ANGIOPLASTY N/A 09/10/2022    Performed by Vertie Amelie BROCKS, MD at [facility] CATH LAB        Family History:  Patient mentioned that his mother died of congestive heart failure at the age of 55 years.  Patient mentioned that his brother also had congestive heart failure.  No other family history.    Social History:  He lives alone and has been receiving help from a companion  He has 3 kids.  He is married but separated.  His wife still helps him with medications and  groceries.  He is a cytogeneticist.  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  No drinking, no smoking and no illicit drugs       Review of Systems:  General: Denies fever, chills, malaise, night sweats, significant weight change  HEENT: negative for dry/itchy eyes, congestion, sore throat  Pulm: negative for cough, hemoptysis, and shortness of breath at rest  CV: as per HPI  GI: negative for N/V, abdominal pain, blood in stools, constipation, diarrhea  GU: negative for dysuria, hematuria  MSK: Positive for multiple joint pain, uses walker for walking.  He has history of knee replacement and hip replacement.  Neuro: No dizziness or lightheadedness or loss of consciousness.  Heme/Lymph: Bruises easily.  Derm: Has some skin bruising.    Medications:    Current Outpatient Medications:     acetaminophen  (TYLENOL  EXTRA STRENGTH) 500 mg tablet, Take one tablet by mouth every 6 hours as needed for Pain. Max of 4,000 mg of acetaminophen  in 24 hours., Disp: , Rfl:     allopurinoL  (ZYLOPRIM ) 100 mg tablet, Take one tablet by mouth daily. Take with food.  Indications: treatment to prevent acute gout attack, Disp: 28 tablet, Rfl: 0    amoxicillin (AMOXIL) 500 mg capsule, Take 4 capsules by mouth one hour prior to dental appointment or procedure AS DIRECTED, Disp: , Rfl:     apixaban  (ELIQUIS ) 5 mg tablet, Take one tablet by mouth twice daily., Disp: 60 tablet, Rfl: 11    carvediloL  (COREG ) 6.25 mg tablet, Take 1.5 tablets by mouth twice daily with meals. Take with food., Disp: 180 tablet, Rfl: 3    celecoxib  (CELEBREX ) 100 mg capsule, Take one capsule by mouth twice daily as needed for Pain., Disp: 60 capsule, Rfl: 3    CHOLEcalciferoL (vitamin D3) (VITAMIN D3) 50 mcg (2,000 unit) tablet, Take one tablet by mouth daily., Disp: , Rfl:     clopiDOGreL  (PLAVIX ) 75 mg tablet, Take one tablet by mouth daily. [facility]  Indications: blood clot prevention following percutaneous coronary intervention, Disp: 90 tablet, Rfl: 3    colchicine (COLCRYS) 0.6 mg tablet, Take one tablet by mouth as Needed., Disp: , Rfl:     cyanocobalamin (vitamin B-12) 1,000 mcg capsule, Take one capsule by mouth three times weekly. Monday, Wednesday, Friday, Disp: , Rfl:     diclofenac  sodium (VOLTAREN ) 1 % topical gel, Apply four g topically to affected area every 8 hours as needed., Disp: , Rfl:     empagliflozin (JARDIANCE) 25 mg tablet, Take one tablet by mouth daily., Disp: , Rfl:     finasteride  (PROSCAR ) 5 mg tablet, Take one tablet by mouth daily., Disp: , Rfl:     lidocaine  (LIDODERM ) 5 % topical patch, Apply one patch topically to affected area as Needed. Apply patch for 12 hours, then remove for 12 hours before repeating., Disp: , Rfl:     Meclizine 25 mg chew, Chew one tablet by mouth as Needed., Disp: , Rfl:     nitroglycerin (NITROSTAT) 0.4 mg tablet, Place one tablet under tongue. DISSOLVE ONE TABLET UNDER THE TONGUE AS DIRECTED BY PROVIDER FOR CHEST PAIN - TAKE WHILE SITTING DOWN. IF NO IMPROVEMENT AFTER FIRST DOSE CALL 9-1-1. MAY TAKE 2 ADDITIONAL DOSES, 5 MINUTES APART. - TAKE WHILE SITTING DOWN. IF NO IMPROVEMENT AFTER FIRST DOSE CALL 9-1-1. MAY TAKE 2 ADDITIONAL DOSES, 5 MINUTES APART. FOR  CHEST PAIN, Disp: , Rfl:     omeprazole DR(+) (PRILOSEC) 20 mg capsule, Take one capsule by mouth daily., Disp: , Rfl:     predniSONE (STERAPRED) 5 mg tablet dose pack, Take  by mouth as Needed., Disp: , Rfl:     rosuvastatin  (CRESTOR ) 20 mg tablet, Take one tablet by mouth daily. [facility]  Indications: treatment to prevent a heart attack, Disp: 90 tablet, Rfl: 3    sacubitriL -valsartan  (ENTRESTO ) 49-51 mg tablet, Take one tablet by mouth twice daily., Disp: 180 tablet, Rfl: 3    spironolactone  (ALDACTONE ) 25 mg tablet, Take one tablet by mouth daily. Take with food. [facility]  Indications: heart failure with reduced ejection fraction, Disp: 90 tablet, Rfl: 3    tamsulosin  (FLOMAX ) 0.4 mg capsule, Take two capsules by mouth daily., Disp: , Rfl:     Allergies:   Allergies   Allergen Reactions    Shellfish Containing Products HIVES    Duloxetine NAUSEA AND VOMITING    Levothyroxine SEE COMMENTS     Caused feeling of weakness    Lisinopril COUGH       Objective:  Ht 177.8 cm (5' 10)  - BMI 26.63 kg/m?   BP Readings from Last 3 Encounters:   05/24/23 112/68   03/03/23 120/89   11/04/22 131/88      Pulse Readings from Last 3 Encounters:   05/24/23 59   03/03/23 61   11/04/22 78     Wt Readings from Last 8 Encounters:   05/24/23 84.2 kg (185 lb 9.6 oz)   03/29/23 89 kg (196 lb 3.4 oz)   03/03/23 82.9 kg (182 lb 12.8 oz)   11/04/22 84.6 kg (186 lb 6.4 oz)   10/20/22 89 kg (196 lb 3.4 oz) 10/14/22 85.3 kg (188 lb)   09/13/22 87.1 kg (192 lb)   04/15/22 89.4 kg (197 lb)     Constitutional: He appears well-developed and well-nourished.   HENT:  Head: Normocephalic.   Mouth/Throat: Oropharynx is clear and moist.   Eyes: Conjunctivae are normal.   Neck: Normal range of motion.  JVP is normal today.  Chest wall: Well-healed midline incision.  Cardiovascular: Irregular rate and rhythm.  There is presence of clear systolic murmur at the mitral area and the tricuspid area.  Pulmonary/Chest: Effort normal and breath sounds normal. No respiratory distress. He has no wheezes. He has no rales. He exhibits no chest wall tenderness.   Abdominal: Soft. Bowel sounds are normal. He exhibits no distension. There is no tenderness.   Musculoskeletal: Very limited range of motion in the left lower extremity.  Unable to flex his left knee.  There is presence of no edema in the bilateral lower extremities.  Neurological: He is alert and oriented to person, place, and time. No focal deficits.  Skin: Skin is warm. No erythema.   Psychiatric: He has a normal mood and affect. Judgment, behavior, and thought content normal.       Labs Reviewed:  CBC w/Diff    Lab Results   Component Value Date/Time    WBC 7.66 02/03/2023 12:00 AM    RBC 5.98 (H) 02/03/2023 12:00 AM    HGB 16.1 02/03/2023 12:00 AM    HCT 51.0 (H) 02/03/2023 12:00 AM    MCV 85.3 02/03/2023 12:00 AM    MCH 26.9 (L) 02/03/2023 12:00 AM    MCHC 31.6 (L) 02/03/2023 12:00 AM    RDW 17.8 (  H) 02/03/2023 12:00 AM    PLTCT 182 02/03/2023 12:00 AM    MPV 9.8 02/03/2023 12:00 AM    Lab Results   Component Value Date/Time    NEUT 77 09/08/2022 10:10 PM    ANC 6.26 09/08/2022 10:10 PM    LYMA 15 (L) 09/08/2022 10:10 PM    ALC 1.17 09/08/2022 10:10 PM    MONA 6 09/08/2022 10:10 PM    AMC 0.52 09/08/2022 10:10 PM    EOSA 1 09/08/2022 10:10 PM    AEC 0.05 09/08/2022 10:10 PM    BASA 1 09/08/2022 10:10 PM    ABC 0.06 09/08/2022 10:10 PM        Comprehensive Metabolic Profile Lab Results   Component Value Date/Time    NA 141 06/13/2023 12:00 AM    K 4.4 06/13/2023 12:00 AM    K 4.2 02/03/2023 12:00 AM    K 4.6 10/14/2022 12:00 AM    K 4.6 10/06/2022 12:00 AM    CL 108 (H) 06/13/2023 12:00 AM    CO2 21 (L) 06/13/2023 12:00 AM    GAP 13 10/14/2022 12:00 AM    BUN 30 (H) 06/13/2023 12:00 AM    CR 1.28 06/13/2023 12:00 AM    CR 1.30 02/03/2023 12:00 AM    CR 1.12 10/14/2022 12:00 AM    CR 1.27 10/06/2022 12:00 AM    GLU 142 (H) 06/13/2023 12:00 AM    Lab Results   Component Value Date/Time    CA 10.2 06/13/2023 12:00 AM    PO4 3.3 12/06/2002 03:50 AM    ALBUMIN 4.3 06/13/2023 12:00 AM    TOTPROT 7.4 06/13/2023 12:00 AM    ALKPHOS 79 06/13/2023 12:00 AM    AST 13 06/13/2023 12:00 AM    ALT 13 06/13/2023 12:00 AM    TOTBILI 0.9 06/13/2023 12:00 AM    GFR 56 06/13/2023 12:00 AM    GFRAA >60 11/11/2018 04:08 AM        Cardiac Enzymes Lipids   Lab Results   Component Value Date/Time    BNP 333 (H) 10/14/2022 12:00 AM    BNP 199.3 (H) 10/06/2022 12:00 AM    TNI 6.76 (H) 12/04/2002 05:00 AM    TNI 10.40 (H) 12/04/2002 12:01 AM    TNI 7.02 (H) 12/03/2002 04:27 PM    Lab Results   Component Value Date    CHOL 89 06/13/2023    TRIG 66 06/13/2023    HDL 31 (L) 06/13/2023    LDL 45 06/13/2023    VLDL 25 09/10/2022    NONHDLCHOL 61 09/10/2022         Coagulation Endocrine   Lab Results   Component Value Date/Time    INR 1.1 11/10/2018 09:26 AM    INR 1.3 12/06/2002 11:26 AM    INR 1.2 12/05/2002 03:00 AM    PT 13.3 12/06/2002 11:26 AM    Lab Results   Component Value Date/Time    HGBA1C 6.7 (H) 06/13/2023 12:00 AM    HGBA1C 7.0 (H) 02/03/2023 12:00 AM    HGBA1C 7.1 (H) 10/06/2022 12:00 AM    TSH 4.247 02/03/2023 12:00 AM             Imaging and procedures reviewed:  Echo:   03/29/23:  Moderate LV systolic dysfunction EF estimated 40%.  LVIDD 4.7 cm.  LV diastolic volume index 74 mL/m?SABRA  Normal LV dimensions for cavity size.  Wall motion abnormalities localized to the basal to mid anterolateral/inferolateral/inferior myocardial  territories.  Relative myocardial thinning suggestive of prior infarct in a left circumflex distribution/OM.  The RV is mildly dilated.  Likely mild RV systolic dysfunction.  RV pacer wire identified.  Mild-moderate MR.  Moderate TR.  Mild transvalvular AI.  CVP 3 mmHg.  PA systolic pressure estimate 26 mmHg.     Compared to a 2024 study.  Continue demonstration of moderate LV dysfunction EF estimated 40%.  The RV does appear to currently be mildly dilated.  Some views suggestive of mild RV systolic dysfunction.  Redemonstration the patient's wall motion abnormalities involving the anterolateral/inferolateral/inferior territories.    10/20/22    The left ventricular size is normal. The left ventricular wall thickness is normal. The left ventricular systolic function is moderately reduced. The visually estimated ejection fraction is 35-40%. There are segmental wall motion abnormalities, as described below. Abnormal septal motion consistent with right ventricular pacing.    The right ventricular size is normal. The right ventricular systolic function is probably normal.    Left Atrium: Severely dilated.    Mitral Valve: The mitral valve was not well seen. Non-specific thickening. No stenosis.  Poorly characterized jet of at least moderate regurgitation.    Estimated Peak Systolic PA Pressure 46 mmHg    Elevated estimated central venous pressure (5-10 mm Hg).    The ascending aorta is dilated.  (4.4 cm).  The aortic root is dilated.  (4.2 cm)    No pericardial effusion.    Compared with study dated 09/09/2022, no significant change is noted.    09/09/22:  Moderate left ventricular dilatation with moderately reduced systolic function, biplane LVEF 34%.  Unable to assess diastolic function due to underlying atrial arrhythmia.  There is severe hypokinesis/akinesis of the anterior, anterolateral, and inferolateral myocardial segments.    Severe left atrial enlargement.  Mild right atrial enlargement.  Mild mitral or calcification without stenosis.  Moderate mitral regurgitation due to dilated annulus with a centrally directed regurgitant.  Mild tricuspid regurgitation.  Sclerotic aortic valve with mild aortic insufficiency.  No aortic stenosis.  Plethoric IVC, estimated CVP ~15 mmHg.  Elevated pulmonary pressures, estimated PASP 49 mmHg (<50% SBP)  Moderate dilatation of the aortic root measuring 4.4 cm, moderate dilatation of the proximal ascending aorta measuring 4.5 cm.  No pericardial effusion.     Comparison is made with previous echocardiogram dated 04/06/2022: The previous study was technically difficult due to poor endocardial visualization.  Echo contrasting agent was not utilized on the prior study.  There has been interval increase in LV size and reduction in LV function, previously LVEF was 60%.  The limited views of the LV endocardium did not demonstrate any overt wall motion abnormalities on the prior study and the anterior and lateral areas of hypokinesis on today's study appear new. The left atrium continues to be severely dilated.  The valvular lesions appear similar.  There has been an increase in pulmonary pressures, previously estimated PASP was 29 mmHg + CVP.  On the prior study, the aortic root at the sinuses of Valsalva measured 4.4 cm and the ascending aorta measured 4.3 cm.    Stress test: 03/02/18:  SUMMARY/OPINION:   This study is abnormal with a mostly fixed but viable defect in the inferior wall.  The very prominent liver activity may lower the sensitivity of the study due to shine through artifact in the inferior wall.  No other definite perfusion defects are present global left rectal function is within normal limits.  There is mild abnormal septal  motion present.     This study is compared to the patient's previous myocardial perfusion study from 2015.     The previous study was performed with thallous chloride and pharmacological stress and was interpreted to be normal.  The current exam however is most consistent with a limited scar in the inferior wall perhaps with slight residual reversible ischemia.    Cardiac cath on 09/10/22:  FINAL IMPRESSION:    Occluded SVG to the ramus intermedius, OM1, OM2, OM3 sequential graft with no significant collaterals to the native vessels from the left system or the right system.  SVG to the right PDA has tandem 50% lesions in its proximal segment, IFR negative at 0.98.  Anastomotic site has an ostial 40% stenosis, non-flow limiting in nature.  Culprit lesion ostial LAD 80% severely calcific stenosis extending into the left main:  Status post PCI with IVL 4.0, followed by IVUS-guided PCI with a 4.0 X 23 mm Xience DES, postdilated with a 5.0 NC with a minimal stent area 13 sq mm.  Plavix  plus oral anticoagulation for the next 1 year, followed by aspirin  plus OAC for life.    Tc HDP scan: 06/01/23:  SUMMARY/OPINION:   This study is not suggestive of ATTR cardiac amyloidosis.    Immunization History   Administered Date(s) Administered    COVID-19 (MODERNA), mRNA vacc, 100 mcg/0.5 mL (PF) 02/27/2019, 03/27/2019, 11/26/2019, 05/27/2020    Covid-19 Bivalent (26yr+)(MODERNA), mRNA Vacc, 50 mcg/0.5 mL (PF) 11/11/2020    Covid-19 mRNA Vaccine >=12yo (Moderna)(Spikevax) 02/03/2023    Covid-19 mRNA Vaccine >=12yo (Pfizer)(Comirnaty) 11/11/2021         Assessment/Plan:  CLAUS SILVESTRO is a 83 y.o. 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      # CAD status post CABG in 2004 (SVG to PDA, reverse SVG to RI, OM1, OM2, and OM3)  # Now has occluded SVG to ramus, OM1, OM 2 and OM 3 sequential graft  #NSTEMI status post PCI to left main and LAD on 09/10/22  # Ischemic cardiomyopathy  #Hyperlipidemia  #Hypertension  #Moderate mitral regurgitation  #History of NSVT  -Patient today appears to be euvolemic on examination. His weight today is 185 pounds.  -LVEF 34% in August 2024.  Details of echo mentioned above.  His echocardiogram in September 2024 shows LVEF of around 40%.  -Now LVEF in February 2025 of around 40%.  Plan  HF Therapy:    BB: continue Coreg  9.375 mg twice daily    ACE-I/ARB: continue Entresto  49-51 mg twice daily.     MRA: spironolactone  25 mg daily    SGLT2i: Continue Jardiance 25 mg daily    ICD: LVEF greater than 35% no need for ICD currently.    diuretic: None required.  Patient appears euvolemic currently.  > Due to recent history of NSTEMI requiring PCI to the left mid LAD in August 2024 recommend continuing Plavix  75 mg daily.  He is not on aspirin  as he is on Eliquis  for atrial fibrillation.  > Continue Crestor  20 mg daily.  His last LDL was 45 on 06/13/2023.   >  Continue GDMT for long-term.  > He status post cardiac rehab.  > Continue to medically manage his mitral regurgitation for now.  He is wishing to avoid procedures in future if possible.  > We will repeat echocardiogram in 6 months  > He is going to have his PCP check labs in couple of weeks and advised him to send me the labs.    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  Echocardiogram in February 2025 shows ascending aorta of 4.7 cm.  > Continue to monitor with yearly echocardiogram.    #Type 2 diabetes mellitus: Noted that patient is on Jardiance 25 mg daily.  Hemoglobin A1c of 6.7% in May 2025.     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  Continue Eliquis  5 mg twice daily.  I discussed with him regarding Watchman device implantation.  Patient is not interested in more procedures.  Will continue Eliquis .  He did mention that if he has more falls than he will consider watchman in the future may be.    #History of right hip replacement, left knee replacement  Chronic pain  - I will prescribe him Celebrex  100 mg twice daily as needed.  Advised patient to not take Celebrex  every day though.  There is a small increase of risk of bleeding given he is on Eliquis  and Plavix .    Return to heart failure clinic: With me in 5 months.    Thank you for allowing me to participate in the care of this patient.  Please do not hesitate to contact me should you have any questions or concerns.         Total time spent on today's office visit was 32 minutes.  This includes face-to-face in person visit with patient as well as nonface-to-face time including review of the EMR, outside records, labs, radiologic studies, echocardiogram & other cardiovascular studies, formulation of treatment plan, after visit summary, future disposition,  and lastly on documentation.    Velva Pate, MD  Advanced Heart Failure and Heart Transplant Cardiologist  [facility]  [facility]  Pager: [phone]

## 2023-12-15 ENCOUNTER — Encounter: Admit: 2023-12-15 | Discharge: 2023-12-15 | Payer: MEDICARE

## 2023-12-15 MED ORDER — CARVEDILOL 6.25 MG PO TAB
ORAL_TABLET | ORAL | 3 refills | 90.00000 days | Status: AC
Start: 2023-12-15 — End: ?

## 2023-12-26 ENCOUNTER — Encounter: Admit: 2023-12-26 | Discharge: 2023-12-26 | Payer: MEDICARE

## 2023-12-27 ENCOUNTER — Encounter: Admit: 2023-12-27 | Discharge: 2023-12-27 | Payer: MEDICARE

## 2023-12-27 DIAGNOSIS — I495 Sick sinus syndrome: Secondary | ICD-10-CM

## 2023-12-27 DIAGNOSIS — I25810 Atherosclerosis of coronary artery bypass graft(s) without angina pectoris: Secondary | ICD-10-CM

## 2023-12-27 DIAGNOSIS — Z950 Presence of cardiac pacemaker: Secondary | ICD-10-CM

## 2023-12-27 DIAGNOSIS — I255 Ischemic cardiomyopathy: Secondary | ICD-10-CM

## 2023-12-27 DIAGNOSIS — I5020 Unspecified systolic (congestive) heart failure: Principal | ICD-10-CM

## 2023-12-27 DIAGNOSIS — R001 Bradycardia, unspecified: Secondary | ICD-10-CM

## 2024-01-13 ENCOUNTER — Encounter: Admit: 2024-01-13 | Discharge: 2024-01-13 | Payer: MEDICARE

## 2024-02-22 ENCOUNTER — Encounter: Admit: 2024-02-22 | Discharge: 2024-02-22 | Payer: MEDICARE

## 2024-02-22 ENCOUNTER — Ambulatory Visit: Admit: 2024-02-22 | Discharge: 2024-02-22 | Payer: MEDICARE

## 2024-02-24 ENCOUNTER — Encounter: Admit: 2024-02-24 | Discharge: 2024-02-24 | Payer: MEDICARE
